# Patient Record
Sex: Male | Born: 1950
Health system: Southern US, Community
[De-identification: ages and names within clinical notes are randomized; demographics above are authoritative.]

## PROBLEM LIST (undated history)

## (undated) DIAGNOSIS — E785 Hyperlipidemia, unspecified: Secondary | ICD-10-CM

## (undated) DIAGNOSIS — I10 Essential (primary) hypertension: Secondary | ICD-10-CM

## (undated) HISTORY — DX: Hyperlipidemia, unspecified: E78.5

## (undated) HISTORY — DX: Essential (primary) hypertension: I10

---

## 1994-06-06 HISTORY — PX: NASAL SINUS SURGERY: SHX719

## 2001-06-06 HISTORY — PX: HERNIA REPAIR: SHX51

## 2002-07-12 ENCOUNTER — Encounter: Admission: RE | Admit: 2002-07-12 | Discharge: 2002-07-12 | Payer: Self-pay | Admitting: Family Medicine

## 2002-07-12 ENCOUNTER — Encounter: Payer: Self-pay | Admitting: Family Medicine

## 2013-06-13 ENCOUNTER — Ambulatory Visit: Payer: Self-pay | Admitting: Sports Medicine

## 2013-06-17 ENCOUNTER — Ambulatory Visit (INDEPENDENT_AMBULATORY_CARE_PROVIDER_SITE_OTHER): Payer: BC Managed Care – PPO | Admitting: Sports Medicine

## 2013-06-20 ENCOUNTER — Encounter: Payer: Self-pay | Admitting: Sports Medicine

## 2013-06-20 ENCOUNTER — Ambulatory Visit (INDEPENDENT_AMBULATORY_CARE_PROVIDER_SITE_OTHER): Payer: BC Managed Care – PPO | Admitting: Sports Medicine

## 2013-06-20 VITALS — BP 157/85 | HR 86 | Ht 67.0 in | Wt 158.0 lb

## 2013-06-20 DIAGNOSIS — I6381 Other cerebral infarction due to occlusion or stenosis of small artery: Secondary | ICD-10-CM | POA: Insufficient documentation

## 2013-06-20 DIAGNOSIS — Z299 Encounter for prophylactic measures, unspecified: Secondary | ICD-10-CM

## 2013-06-20 DIAGNOSIS — R200 Anesthesia of skin: Secondary | ICD-10-CM

## 2013-06-20 DIAGNOSIS — R030 Elevated blood-pressure reading, without diagnosis of hypertension: Secondary | ICD-10-CM

## 2013-06-20 DIAGNOSIS — M5137 Other intervertebral disc degeneration, lumbosacral region: Secondary | ICD-10-CM

## 2013-06-20 DIAGNOSIS — R209 Unspecified disturbances of skin sensation: Secondary | ICD-10-CM

## 2013-06-20 DIAGNOSIS — M5136 Other intervertebral disc degeneration, lumbar region: Secondary | ICD-10-CM

## 2013-06-20 DIAGNOSIS — M51379 Other intervertebral disc degeneration, lumbosacral region without mention of lumbar back pain or lower extremity pain: Secondary | ICD-10-CM

## 2013-06-20 DIAGNOSIS — M51369 Other intervertebral disc degeneration, lumbar region without mention of lumbar back pain or lower extremity pain: Secondary | ICD-10-CM | POA: Insufficient documentation

## 2013-06-20 DIAGNOSIS — I10 Essential (primary) hypertension: Secondary | ICD-10-CM | POA: Insufficient documentation

## 2013-06-20 DIAGNOSIS — K044 Acute apical periodontitis of pulpal origin: Secondary | ICD-10-CM

## 2013-06-20 DIAGNOSIS — Z0000 Encounter for general adult medical examination without abnormal findings: Secondary | ICD-10-CM | POA: Insufficient documentation

## 2013-06-20 DIAGNOSIS — K047 Periapical abscess without sinus: Secondary | ICD-10-CM | POA: Insufficient documentation

## 2013-06-20 MED ORDER — PREDNISONE 50 MG PO TABS: ORAL_TABLET | ORAL | Status: DC

## 2013-06-20 MED ORDER — MELOXICAM 15 MG PO TABS: ORAL_TABLET | ORAL | Status: DC

## 2013-06-20 MED ORDER — AMOXICILLIN-POT CLAVULANATE 875-125 MG PO TABS: 1.0000 | ORAL_TABLET | Freq: Two times a day (BID) | ORAL | Status: DC

## 2013-06-20 NOTE — Assessment & Plan Note (Signed)
He does have some pain near the site of a prior extracted tooth. I do hope the numbness in his face is secondary to swelling from this. I am going to treat him with 10 days of Augmentin.

## 2013-06-20 NOTE — Assessment & Plan Note (Signed)
This numbness does involve second branch of cranial nerve 5 and the third branch of cranial nerve 5. Unfortunately this raises the suspicion of a historical lacunar stroke. He does have an appointment with a neurologist. I do suspect he will need a brain MRI with concentration on the pons and fifth cranial nerve.

## 2013-06-20 NOTE — Progress Notes (Signed)
  Subjective:    CC: Establish care.   HPI:  Low back pain: Present for years, MRI in 2004 showed L4-5 and L5-S1 degenerative changes. Pain is axial, worse with coughing and sneezing, okay with severe car and there are no radicular component. Moderate, persistent. No bowel or bladder dysfunction, no constitutional symptoms.  Facial numbness: Left-sided, present for 2 months, no associated headache however he does have some toothache. No visual changes, no numbness or tingling on either side of his body, no changes in his speech or vision. He does have an appointment with neurologist coming up in a week or 2.  Elevated blood pressure: Tells me he usually has normal blood pressure readings.  Infected tooth: Pulled recently had a tooth pulled, now having pain at the site of the extraction. He wonders if this may be the cause of his left-sided facial numbness.  Past medical history, Surgical history, Family history not pertinant except as noted below, Social history, Allergies, and medications have been entered into the medical record, reviewed, and no changes needed.   Review of Systems: No headache, visual changes, nausea, vomiting, diarrhea, constipation, dizziness, abdominal pain, skin rash, fevers, chills, night sweats, swollen lymph nodes, weight loss, chest pain, body aches, joint swelling, muscle aches, shortness of breath, mood changes, visual or auditory hallucinations.  Objective:    General: Well Developed, well nourished, and in no acute distress.  Neuro: Alert and oriented x3, extra-ocular muscles intact, sensation grossly intact. Cranial nerves II through XII are intact with the exception of cranial nerves V2 and V3 on the left side. Motor, sensory, coordination are intact. HEENT: Normocephalic, atraumatic, pupils equal round reactive to light, neck supple, no masses, no lymphadenopathy, thyroid nonpalpable. I do not see any signs of gingivitis or acute infection, he does have some  necrotic teeth on the left side. Skin: Warm and dry, no rashes noted.  Cardiac: Regular rate and rhythm, no murmurs rubs or gallops.  Respiratory: Clear to auscultation bilaterally. Not using accessory muscles, speaking in full sentences.  Abdominal: Soft, nontender, nondistended, positive bowel sounds, no masses, no organomegaly.  Back Exam:  Inspection: Unremarkable  Motion: Flexion 45 deg, Extension 45 deg, Side Bending to 45 deg bilaterally,  Rotation to 45 deg bilaterally  SLR laying: Negative  XSLR laying: Negative  Palpable tenderness: None. FABER: negative. Sensory change: Gross sensation intact to all lumbar and sacral dermatomes.  Reflexes: 2+ at both patellar tendons, 2+ at achilles tendons, Babinski's downgoing.  Strength at foot  Plantar-flexion: 5/5 Dorsi-flexion: 5/5 Eversion: 5/5 Inversion: 5/5  Leg strength  Quad: 5/5 Hamstring: 5/5 Hip flexor: 5/5 Hip abductors: 5/5  Gait unremarkable.  Impression and Recommendations:    The patient was counselled, risk factors were discussed, anticipatory guidance given.

## 2013-06-20 NOTE — Assessment & Plan Note (Signed)
We will start conservatively with prednisone, Mobic, formal physical therapy. He does have an MRI that is 61 years old but shows L4-5 and L5-S1 degenerative disc disease.

## 2013-06-20 NOTE — Assessment & Plan Note (Signed)
We are going to keep an eye on this.

## 2013-06-20 NOTE — Assessment & Plan Note (Signed)
Checking routine bloodwork. 

## 2013-06-27 LAB — CBC
HCT: 42.5 % (ref 39.0–52.0)
Hemoglobin: 14.7 g/dL (ref 13.0–17.0)
MCH: 32.2 pg (ref 26.0–34.0)
MCHC: 34.6 g/dL (ref 30.0–36.0)
MCV: 93 fL (ref 78.0–100.0)
Platelets: 341 10*3/uL (ref 150–400)
RBC: 4.57 MIL/uL (ref 4.22–5.81)
RDW: 15.2 % (ref 11.5–15.5)
WBC: 8.8 10*3/uL (ref 4.0–10.5)

## 2013-06-27 LAB — HEMOGLOBIN A1C
Hgb A1c MFr Bld: 6 % — ABNORMAL HIGH (ref <5.7)
Mean Plasma Glucose: 126 mg/dL — ABNORMAL HIGH (ref <117)

## 2013-06-28 ENCOUNTER — Encounter: Payer: Self-pay | Admitting: Sports Medicine

## 2013-06-28 DIAGNOSIS — E785 Hyperlipidemia, unspecified: Secondary | ICD-10-CM | POA: Insufficient documentation

## 2013-06-28 DIAGNOSIS — R7303 Prediabetes: Secondary | ICD-10-CM | POA: Insufficient documentation

## 2013-06-28 LAB — COMPREHENSIVE METABOLIC PANEL
ALT: 35 U/L (ref 0–53)
AST: 20 U/L (ref 0–37)
Albumin: 4.3 g/dL (ref 3.5–5.2)
Alkaline Phosphatase: 52 U/L (ref 39–117)
BUN: 21 mg/dL (ref 6–23)
CO2: 32 mEq/L (ref 19–32)
Calcium: 9 mg/dL (ref 8.4–10.5)
Chloride: 100 mEq/L (ref 96–112)
Creat: 0.86 mg/dL (ref 0.50–1.35)
Glucose, Bld: 117 mg/dL — ABNORMAL HIGH (ref 70–99)
Potassium: 4 mEq/L (ref 3.5–5.3)
Sodium: 138 mEq/L (ref 135–145)
Total Bilirubin: 0.4 mg/dL (ref 0.3–1.2)
Total Protein: 6.8 g/dL (ref 6.0–8.3)

## 2013-06-28 LAB — LIPID PANEL
Cholesterol: 259 mg/dL — ABNORMAL HIGH (ref 0–200)
HDL: 58 mg/dL (ref >39)
LDL Cholesterol: 179 mg/dL — ABNORMAL HIGH (ref 0–99)
Total CHOL/HDL Ratio: 4.5 Ratio
Triglycerides: 110 mg/dL (ref <150)
VLDL: 22 mg/dL (ref 0–40)

## 2013-06-28 LAB — TSH: TSH: 2.072 u[IU]/mL (ref 0.350–4.500)

## 2013-07-18 ENCOUNTER — Encounter: Payer: Self-pay | Admitting: Sports Medicine

## 2013-07-18 ENCOUNTER — Ambulatory Visit (INDEPENDENT_AMBULATORY_CARE_PROVIDER_SITE_OTHER): Payer: BC Managed Care – PPO | Admitting: Sports Medicine

## 2013-07-18 VITALS — BP 161/92 | HR 80 | Ht 67.0 in | Wt 161.0 lb

## 2013-07-18 DIAGNOSIS — I10 Essential (primary) hypertension: Secondary | ICD-10-CM

## 2013-07-18 DIAGNOSIS — R200 Anesthesia of skin: Secondary | ICD-10-CM

## 2013-07-18 DIAGNOSIS — R209 Unspecified disturbances of skin sensation: Secondary | ICD-10-CM

## 2013-07-18 DIAGNOSIS — E785 Hyperlipidemia, unspecified: Secondary | ICD-10-CM

## 2013-07-18 MED ORDER — ATORVASTATIN CALCIUM 40 MG PO TABS: 40.0000 mg | ORAL_TABLET | Freq: Every day | ORAL | Status: DC

## 2013-07-18 MED ORDER — LISINOPRIL-HYDROCHLOROTHIAZIDE 20-25 MG PO TABS: 1.0000 | ORAL_TABLET | Freq: Every day | ORAL | Status: DC

## 2013-07-18 NOTE — Progress Notes (Signed)
  Subjective:    CC: Followup  HPI: Left facial numbness: This pleasant 63 year old male came in with highly elevated blood pressure and numbness on left side of the face that have been present outside the window for prophylaxis. My initial suspicion was a lacunar stroke in the brain stem. I sent him to neurology, subsequent MRI showed what appeared to be a small cyst versus small lacunar infarct, in a position that would explain his focal neurologic symptoms. Unfortunately the numbness on the left side of the face in the second and third branches of cranial nerve 5 distribution has persisted.  Hypertension: Continue to be elevated.  Hyperlipidemia: Not yet on a statin.  Lumbar degenerative disc disease: pain was predominantly axial, I placed him to conservative measures including prednisone and physical therapy, his back pain has now resolved.  Past medical history, Surgical history, Family history not pertinant except as noted below, Social history, Allergies, and medications have been entered into the medical record, reviewed, and no changes needed.   Review of Systems: No fevers, chills, night sweats, weight loss, chest pain, or shortness of breath.   Objective:    General: Well Developed, well nourished, and in no acute distress.  Neuro: Alert and oriented x3, extra-ocular muscles intact, sensation grossly intact.  HEENT: Normocephalic, atraumatic, pupils equal round reactive to light, neck supple, no masses, no lymphadenopathy, thyroid nonpalpable.  Skin: Warm and dry, no rashes. Cardiac: Regular rate and rhythm, no murmurs rubs or gallops, no lower extremity edema.  Respiratory: Clear to auscultation bilaterally. Not using accessory muscles, speaking in full sentences. Back Exam:  Inspection: Unremarkable  Motion: Flexion 45 deg, Extension 45 deg, Side Bending to 45 deg bilaterally,  Rotation to 45 deg bilaterally  SLR laying: Negative  XSLR laying: Negative  Palpable  tenderness: None. FABER: negative. Sensory change: Gross sensation intact to all lumbar and sacral dermatomes.  Reflexes: 2+ at both patellar tendons, 2+ at achilles tendons, Babinski's downgoing.  Strength at foot  Plantar-flexion: 5/5 Dorsi-flexion: 5/5 Eversion: 5/5 Inversion: 5/5  Leg strength  Quad: 5/5 Hamstring: 5/5 Hip flexor: 5/5 Hip abductors: 5/5  Gait unremarkable.  Impression and Recommendations:

## 2013-07-18 NOTE — Assessment & Plan Note (Addendum)
As suspected this likely represents a brainstem lacunar stroke. He is seeing a neurologist. The goal at this point his risk factor modification. Blood pressure control, high dose statin, continue aspirin.

## 2013-07-18 NOTE — Assessment & Plan Note (Signed)
Continues to be elevated. Adding lisinopril/hydrochlorothiazide.

## 2013-07-18 NOTE — Assessment & Plan Note (Signed)
Adding high-dose Lipitor. Aggressive risk factor modification due to recent lacunar stroke.

## 2013-07-24 DIAGNOSIS — Z0289 Encounter for other administrative examinations: Secondary | ICD-10-CM

## 2013-08-01 ENCOUNTER — Ambulatory Visit: Payer: BC Managed Care – PPO | Admitting: Sports Medicine

## 2013-08-19 ENCOUNTER — Ambulatory Visit (INDEPENDENT_AMBULATORY_CARE_PROVIDER_SITE_OTHER): Payer: BC Managed Care – PPO | Admitting: Sports Medicine

## 2013-08-19 ENCOUNTER — Encounter: Payer: Self-pay | Admitting: Sports Medicine

## 2013-08-19 VITALS — BP 178/82 | HR 70 | Ht 67.0 in | Wt 156.0 lb

## 2013-08-19 DIAGNOSIS — J387 Other diseases of larynx: Secondary | ICD-10-CM

## 2013-08-19 DIAGNOSIS — I10 Essential (primary) hypertension: Secondary | ICD-10-CM

## 2013-08-19 DIAGNOSIS — K219 Gastro-esophageal reflux disease without esophagitis: Secondary | ICD-10-CM | POA: Insufficient documentation

## 2013-08-19 MED ORDER — AMLODIPINE BESYLATE 10 MG PO TABS: 10.0000 mg | ORAL_TABLET | Freq: Every day | ORAL | Status: DC

## 2013-08-19 MED ORDER — DEXLANSOPRAZOLE 60 MG PO CPDR: 60.0000 mg | DELAYED_RELEASE_CAPSULE | Freq: Every day | ORAL | Status: DC

## 2013-08-19 NOTE — Assessment & Plan Note (Signed)
Adding some samples of Dexilant. If no better in one month I would recommend ENT referral for direct laryngoscopy, he is a smoker.

## 2013-08-19 NOTE — Assessment & Plan Note (Signed)
Improved, but not perfect. Adding amlodipine. Return in 2 weeks.

## 2013-08-19 NOTE — Progress Notes (Signed)
  Subjective:    CC: Followup  HPI: Hypertension: Improved significantly since starting lisinopril/hydrochlorothiazide, unfortunately still outside the range. No headaches, no further strokelike symptoms.  Sore throat : present for months, is worse after smoking, and when drinking coffee. Moderate, persistent, no cough, no blood.  Hyperlipidemia: Stable on statin.  Past medical history, Surgical history, Family history not pertinant except as noted below, Social history, Allergies, and medications have been entered into the medical record, reviewed, and no changes needed.   Review of Systems: No fevers, chills, night sweats, weight loss, chest pain, or shortness of breath.   Objective:    General: Well Developed, well nourished, and in no acute distress.  Neuro: Alert and oriented x3, extra-ocular muscles intact, sensation grossly intact.  HEENT: Normocephalic, atraumatic, pupils equal round reactive to light, neck supple, no masses, no lymphadenopathy, thyroid nonpalpable.  Skin: Warm and dry, no rashes. Cardiac: Regular rate and rhythm, no murmurs rubs or gallops, no lower extremity edema.  Respiratory: Clear to auscultation bilaterally. Not using accessory muscles, speaking in full sentences.  Impression and Recommendations:

## 2013-09-02 ENCOUNTER — Encounter: Payer: Self-pay | Admitting: Sports Medicine

## 2013-09-02 ENCOUNTER — Ambulatory Visit (INDEPENDENT_AMBULATORY_CARE_PROVIDER_SITE_OTHER): Payer: BC Managed Care – PPO | Admitting: Sports Medicine

## 2013-09-02 VITALS — BP 181/95 | HR 71 | Ht 67.0 in | Wt 158.0 lb

## 2013-09-02 DIAGNOSIS — K219 Gastro-esophageal reflux disease without esophagitis: Secondary | ICD-10-CM

## 2013-09-02 DIAGNOSIS — J387 Other diseases of larynx: Secondary | ICD-10-CM

## 2013-09-02 DIAGNOSIS — I10 Essential (primary) hypertension: Secondary | ICD-10-CM

## 2013-09-02 NOTE — Assessment & Plan Note (Signed)
Symptoms are persistent despite treatment with Dexilant. Considering his smoking history I do think he needs to see an ENT doctor for direct visualization.

## 2013-09-02 NOTE — Assessment & Plan Note (Signed)
Persistently elevated but he has not yet started amlodipine. He started amlodipine today, and return to see me in one week.

## 2013-09-02 NOTE — Progress Notes (Signed)
  Subjective:    CC: Follow up  HPI: Hypertension: Continue to be elevated, he does get occasional episodes of dizziness and does have a history of a lacunar stroke confirmed on MRI that has resulted in some perioral numbness. Unfortunately he has not yet started the amlodipine that was prescribed 2 weeks ago.  Throat soreness: Has now been treated for several weeks with Dexilant, unfortunately continues to have persistent symptoms, throat soreness, there has been no improvement, not even slightly with the PPI.  Past medical history, Surgical history, Family history not pertinant except as noted below, Social history, Allergies, and medications have been entered into the medical record, reviewed, and no changes needed.   Review of Systems: No fevers, chills, night sweats, weight loss, chest pain, or shortness of breath.   Objective:    General: Well Developed, well nourished, and in no acute distress.  Neuro: Alert and oriented x3, extra-ocular muscles intact, sensation grossly intact.  HEENT: Normocephalic, atraumatic, pupils equal round reactive to light, neck supple, no masses, no lymphadenopathy, thyroid nonpalpable.  Skin: Warm and dry, no rashes. Cardiac: Regular rate and rhythm, no murmurs rubs or gallops, no lower extremity edema.  Respiratory: Clear to auscultation bilaterally. Not using accessory muscles, speaking in full sentences.  Impression and Recommendations:

## 2013-09-09 ENCOUNTER — Ambulatory Visit (INDEPENDENT_AMBULATORY_CARE_PROVIDER_SITE_OTHER): Payer: BC Managed Care – PPO | Admitting: Sports Medicine

## 2013-09-09 ENCOUNTER — Encounter: Payer: Self-pay | Admitting: Sports Medicine

## 2013-09-09 VITALS — BP 123/76 | HR 78 | Ht 67.0 in | Wt 153.0 lb

## 2013-09-09 DIAGNOSIS — I6350 Cerebral infarction due to unspecified occlusion or stenosis of unspecified cerebral artery: Secondary | ICD-10-CM

## 2013-09-09 DIAGNOSIS — I10 Essential (primary) hypertension: Secondary | ICD-10-CM

## 2013-09-09 DIAGNOSIS — I6381 Other cerebral infarction due to occlusion or stenosis of small artery: Secondary | ICD-10-CM

## 2013-09-09 DIAGNOSIS — K219 Gastro-esophageal reflux disease without esophagitis: Secondary | ICD-10-CM

## 2013-09-09 DIAGNOSIS — J387 Other diseases of larynx: Secondary | ICD-10-CM

## 2013-09-09 MED ORDER — PREGABALIN 75 MG PO CAPS: 75.0000 mg | ORAL_CAPSULE | Freq: Two times a day (BID) | ORAL | Status: DC

## 2013-09-09 NOTE — Assessment & Plan Note (Signed)
Finally controlled.

## 2013-09-09 NOTE — Progress Notes (Signed)
  Subjective:    CC: Followup  HPI: Hypertension: Finally well controlled, he did take all of his medications.  Lacunar stroke: Continues to have left-sided intraoral numbness and tingling, he was seen by a dentist, dentist did not see any intervention needed.  Throat soreness: I have given him a diagnosis of laryngeal pharyngeal reflux disease, he has not improved despite a week of proton pump inhibitors, considering his history of smoking, we did refer him to ENT, he has not yet seen otolaryngology yet.  Hyperlipidemia: Stable on current statin.  Past medical history, Surgical history, Family history not pertinant except as noted below, Social history, Allergies, and medications have been entered into the medical record, reviewed, and no changes needed.   Review of Systems: No fevers, chills, night sweats, weight loss, chest pain, or shortness of breath.   Objective:    General: Well Developed, well nourished, and in no acute distress.  Neuro: Alert and oriented x3, extra-ocular muscles intact, sensation grossly intact.  HEENT: Normocephalic, atraumatic, pupils equal round reactive to light, neck supple, no masses, no lymphadenopathy, thyroid nonpalpable. No discrete areas of tenderness to palpation in the mouth. Teeth are not in great shape but are nontender, and not abscessed. Skin: Warm and dry, no rashes. Cardiac: Regular rate and rhythm, no murmurs rubs or gallops, no lower extremity edema.  Respiratory: Clear to auscultation bilaterally. Not using accessory muscles, speaking in full sentences.  Impression and Recommendations:

## 2013-09-09 NOTE — Assessment & Plan Note (Signed)
Not much better, awaiting ENT appointment.

## 2013-09-09 NOTE — Assessment & Plan Note (Addendum)
Continues to have pain in the left perioral region described as numbness and tingling. This is likely secondary to his lacunar stroke and unlikely to resolve. I am adding low-dose Lyrica for him to try.  Short-term disability paperwork filled out.

## 2013-10-02 ENCOUNTER — Telehealth: Payer: Self-pay

## 2013-10-02 NOTE — Telephone Encounter (Signed)
Forms along with OV note has been faxed to [facility] [employer]. Garron Eline,CMA

## 2013-10-02 NOTE — Telephone Encounter (Signed)
Completed form is in my box.

## 2013-10-02 NOTE — Telephone Encounter (Signed)
patient and his spouse has called multiple times requesting that a disability form be completed it was received on 10/01/2013. Per patient form needs to be faxed by 10/03/2013 in order for him to receive his short term disability. FORM IS IN YOU BASKET. Rhonda Cunningham,CMA

## 2013-10-21 ENCOUNTER — Encounter: Payer: Self-pay | Admitting: Sports Medicine

## 2013-10-21 ENCOUNTER — Ambulatory Visit (INDEPENDENT_AMBULATORY_CARE_PROVIDER_SITE_OTHER): Payer: BC Managed Care – PPO | Admitting: Sports Medicine

## 2013-10-21 VITALS — BP 145/85 | HR 91 | Ht 67.0 in | Wt 157.0 lb

## 2013-10-21 DIAGNOSIS — I6381 Other cerebral infarction due to occlusion or stenosis of small artery: Secondary | ICD-10-CM

## 2013-10-21 DIAGNOSIS — J387 Other diseases of larynx: Secondary | ICD-10-CM

## 2013-10-21 DIAGNOSIS — I6350 Cerebral infarction due to unspecified occlusion or stenosis of unspecified cerebral artery: Secondary | ICD-10-CM

## 2013-10-21 DIAGNOSIS — E785 Hyperlipidemia, unspecified: Secondary | ICD-10-CM

## 2013-10-21 DIAGNOSIS — K219 Gastro-esophageal reflux disease without esophagitis: Secondary | ICD-10-CM

## 2013-10-21 DIAGNOSIS — I10 Essential (primary) hypertension: Secondary | ICD-10-CM

## 2013-10-21 MED ORDER — AMITRIPTYLINE HCL 50 MG PO TABS: ORAL_TABLET | ORAL | Status: DC

## 2013-10-21 MED ORDER — HYDROCODONE-ACETAMINOPHEN 7.5-325 MG PO TABS: 1.0000 | ORAL_TABLET | Freq: Three times a day (TID) | ORAL | Status: DC | PRN

## 2013-10-21 NOTE — Assessment & Plan Note (Signed)
Continue proton pump inhibitor

## 2013-10-21 NOTE — Assessment & Plan Note (Signed)
He did see the ENT doctor. Flexible laryngoscopy was negative. Throat and mouth symptoms are thus likely related to his lacunar stroke. Lyrica was not effective, adding amitriptyline instead. I would also like him to see neurology.

## 2013-10-21 NOTE — Assessment & Plan Note (Signed)
We will keep an eye on this. If elevated next visit, we are going to start clonidine.

## 2013-10-21 NOTE — Progress Notes (Signed)
  Subjective:    CC:  Followup.  HPI: This pleasant 63 year old male smoker returns for followup of blood pressure, and oral symptoms.  He has had numbness around the mouth and soreness in his throat.  I sent him to ENT for direct visualization considering smoking history, this was negative.  Lyrica was also ineffective with helping his symptoms.  He did see a neurologist after his hospitalization, but desires to be referred to Dr. Loleta ChanceHill. For moderate, persistent.  Hypertension: Improved significantly.  Hyperlipidemia: Stable on Lipitor.  Past medical history, Surgical history, Family history not pertinant except as noted below, Social history, Allergies, and medications have been entered into the medical record, reviewed, and no changes needed.   Review of Systems: No fevers, chills, night sweats, weight loss, chest pain, or shortness of breath.   Objective:    General: Well Developed, well nourished, and in no acute distress.  Neuro: Alert and oriented x3, extra-ocular muscles intact, sensation grossly intact.  HEENT: Normocephalic, atraumatic, pupils equal round reactive to light, neck supple, no masses, no lymphadenopathy, thyroid nonpalpable.  Skin: Warm and dry, no rashes. Cardiac: Regular rate and rhythm, no murmurs rubs or gallops, no lower extremity edema.  Respiratory: Clear to auscultation bilaterally. Not using accessory muscles, speaking in full sentences.  Impression and Recommendations:

## 2013-10-21 NOTE — Assessment & Plan Note (Signed)
-  Continue Lipitor °

## 2013-11-18 ENCOUNTER — Encounter: Payer: Self-pay | Admitting: Sports Medicine

## 2013-11-18 ENCOUNTER — Ambulatory Visit (INDEPENDENT_AMBULATORY_CARE_PROVIDER_SITE_OTHER): Payer: BC Managed Care – PPO | Admitting: Sports Medicine

## 2013-11-18 VITALS — BP 114/67 | HR 90 | Ht 67.0 in | Wt 157.0 lb

## 2013-11-18 DIAGNOSIS — M51379 Other intervertebral disc degeneration, lumbosacral region without mention of lumbar back pain or lower extremity pain: Secondary | ICD-10-CM

## 2013-11-18 DIAGNOSIS — I6350 Cerebral infarction due to unspecified occlusion or stenosis of unspecified cerebral artery: Secondary | ICD-10-CM

## 2013-11-18 DIAGNOSIS — E785 Hyperlipidemia, unspecified: Secondary | ICD-10-CM

## 2013-11-18 DIAGNOSIS — I6381 Other cerebral infarction due to occlusion or stenosis of small artery: Secondary | ICD-10-CM

## 2013-11-18 DIAGNOSIS — M51369 Other intervertebral disc degeneration, lumbar region without mention of lumbar back pain or lower extremity pain: Secondary | ICD-10-CM

## 2013-11-18 DIAGNOSIS — M5136 Other intervertebral disc degeneration, lumbar region: Secondary | ICD-10-CM

## 2013-11-18 DIAGNOSIS — I10 Essential (primary) hypertension: Secondary | ICD-10-CM

## 2013-11-18 DIAGNOSIS — M5137 Other intervertebral disc degeneration, lumbosacral region: Secondary | ICD-10-CM

## 2013-11-18 NOTE — Assessment & Plan Note (Signed)
Good control, in fact systolics were in the 90s today. Asymptomatic.

## 2013-11-18 NOTE — Assessment & Plan Note (Signed)
Unfortunately has persistent left-sided facial numbness, and now has developed a tremor in the right hand. He is seeing his neurologist in 3 days, I have asked him to discontinue his amitriptyline.

## 2013-11-18 NOTE — Progress Notes (Signed)
  Subjective:    CC: Followup  HPI: Hypertension: Well controlled  Hyperlipidemia: Stable on Lipitor  Lacunar stroke colon with persistent left-sided facial numbness: Not improved with Lyrica, gabapentin, or amitriptyline now.  Tremor: Right-sided, this is new for the past couple of weeks, he has an appointment with his neurologist in 3 days.  Past medical history, Surgical history, Family history not pertinant except as noted below, Social history, Allergies, and medications have been entered into the medical record, reviewed, and no changes needed.   Review of Systems: No fevers, chills, night sweats, weight loss, chest pain, or shortness of breath.   Objective:    General: Well Developed, well nourished, and in no acute distress.  Neuro: Alert and oriented x3, extra-ocular muscles intact, sensation grossly intact. There is a right-sided tremor about 3 Hz. HEENT: Normocephalic, atraumatic, pupils equal round reactive to light, neck supple, no masses, no lymphadenopathy, thyroid nonpalpable.  Skin: Warm and dry, no rashes. Cardiac: Regular rate and rhythm, no murmurs rubs or gallops, no lower extremity edema.  Respiratory: Clear to auscultation bilaterally. Not using accessory muscles, speaking in full sentences.  Impression and Recommendations:

## 2013-11-18 NOTE — Assessment & Plan Note (Signed)
He did discuss some lower extremity weakness with ambulation, his last MRI of the lumbar spine was in 2004. I would like to see him back in a month, we can further evaluate the lumbar spine at that time.

## 2013-11-18 NOTE — Assessment & Plan Note (Signed)
-  Continue Lipitor °

## 2013-11-22 LAB — COMPREHENSIVE METABOLIC PANEL
ALT: 45 U/L (ref 0–53)
AST: 35 U/L (ref 0–37)
Albumin: 4.5 g/dL (ref 3.5–5.2)
BUN: 10 mg/dL (ref 6–23)
CO2: 26 mEq/L (ref 19–32)
Calcium: 9.9 mg/dL (ref 8.4–10.5)
Chloride: 96 mEq/L (ref 96–112)
Creat: 0.77 mg/dL (ref 0.50–1.35)
Sodium: 137 mEq/L (ref 135–145)

## 2013-11-22 LAB — COMPREHENSIVE METABOLIC PANEL WITH GFR
Alkaline Phosphatase: 73 U/L (ref 39–117)
Glucose, Bld: 157 mg/dL — ABNORMAL HIGH (ref 70–99)
Potassium: 3.8 meq/L (ref 3.5–5.3)
Total Bilirubin: 0.8 mg/dL (ref 0.2–1.2)
Total Protein: 7 g/dL (ref 6.0–8.3)

## 2013-11-22 LAB — CBC
HCT: 38.6 % — ABNORMAL LOW (ref 39.0–52.0)
Hemoglobin: 13.1 g/dL (ref 13.0–17.0)
MCH: 31.6 pg (ref 26.0–34.0)
MCHC: 33.9 g/dL (ref 30.0–36.0)
MCV: 93.2 fL (ref 78.0–100.0)
Platelets: 321 10*3/uL (ref 150–400)
RBC: 4.14 MIL/uL — ABNORMAL LOW (ref 4.22–5.81)
RDW: 15.1 % (ref 11.5–15.5)
WBC: 11.9 10*3/uL — ABNORMAL HIGH (ref 4.0–10.5)

## 2013-11-22 LAB — TSH: TSH: 2.256 u[IU]/mL (ref 0.350–4.500)

## 2013-11-22 LAB — HEMOGLOBIN A1C
Hgb A1c MFr Bld: 6.1 % — ABNORMAL HIGH (ref <5.7)
Mean Plasma Glucose: 128 mg/dL — ABNORMAL HIGH (ref <117)

## 2013-11-27 ENCOUNTER — Telehealth: Payer: Self-pay | Admitting: *Deleted

## 2013-11-27 ENCOUNTER — Encounter: Payer: Self-pay | Admitting: Sports Medicine

## 2013-11-27 NOTE — Telephone Encounter (Signed)
PT wife just wants it to be longer than June 30 she does not have a specific date in mind.

## 2013-11-27 NOTE — Telephone Encounter (Signed)
Pt wife called and would like pt written out of work longer than June 30.  SHe would like the work note faxed.  Please advise.  KG CMA

## 2013-11-27 NOTE — Telephone Encounter (Signed)
How long, needs specifics for the work note.

## 2013-11-27 NOTE — Telephone Encounter (Signed)
Letter in box. 

## 2013-12-09 ENCOUNTER — Ambulatory Visit: Payer: BC Managed Care – PPO | Admitting: Sports Medicine

## 2013-12-16 ENCOUNTER — Ambulatory Visit: Payer: BC Managed Care – PPO | Admitting: Sports Medicine

## 2013-12-19 ENCOUNTER — Ambulatory Visit (INDEPENDENT_AMBULATORY_CARE_PROVIDER_SITE_OTHER): Payer: BC Managed Care – PPO | Admitting: Sports Medicine

## 2013-12-19 ENCOUNTER — Encounter: Payer: Self-pay | Admitting: Sports Medicine

## 2013-12-19 VITALS — BP 122/72 | HR 93 | Ht 67.0 in | Wt 158.0 lb

## 2013-12-19 DIAGNOSIS — I6381 Other cerebral infarction due to occlusion or stenosis of small artery: Secondary | ICD-10-CM

## 2013-12-19 DIAGNOSIS — I10 Essential (primary) hypertension: Secondary | ICD-10-CM

## 2013-12-19 DIAGNOSIS — R7309 Other abnormal glucose: Secondary | ICD-10-CM

## 2013-12-19 DIAGNOSIS — I6350 Cerebral infarction due to unspecified occlusion or stenosis of unspecified cerebral artery: Secondary | ICD-10-CM

## 2013-12-19 DIAGNOSIS — R7303 Prediabetes: Secondary | ICD-10-CM

## 2013-12-19 DIAGNOSIS — E785 Hyperlipidemia, unspecified: Secondary | ICD-10-CM

## 2013-12-19 DIAGNOSIS — G4700 Insomnia, unspecified: Secondary | ICD-10-CM

## 2013-12-19 MED ORDER — ZOLPIDEM TARTRATE 10 MG PO TABS: 10.0000 mg | ORAL_TABLET | Freq: Every evening | ORAL | Status: DC | PRN

## 2013-12-19 NOTE — Progress Notes (Signed)
  Subjective:    CC: Followup  HPI: Hypertension: Well controlled.  Hyperlipidemia: Stable on Lipitor.  Lacunar stroke: Continued numbness in the mouth, amitriptyline, Lyrica, gabapentin have not been effective.  Tremor: Has seen a neurologist but has not discussed this in depth with him. He does have a visit coming up during which he will discuss it further.   Insomnia: Has been taking 3-4 shots of whiskey to help him sleep.  Past medical history, Surgical history, Family history not pertinant except as noted below, Social history, Allergies, and medications have been entered into the medical record, reviewed, and no changes needed.   Review of Systems: No fevers, chills, night sweats, weight loss, chest pain, or shortness of breath.   Objective:    General: Well Developed, well nourished, and in no acute distress.  Neuro: Alert and oriented x3, extra-ocular muscles intact, sensation grossly intact.  HEENT: Normocephalic, atraumatic, pupils equal round reactive to light, neck supple, no masses, no lymphadenopathy, thyroid nonpalpable.  Skin: Warm and dry, no rashes. Cardiac: Regular rate and rhythm, no murmurs rubs or gallops, no lower extremity edema.  Respiratory: Clear to auscultation bilaterally. Not using accessory muscles, speaking in full sentences.  Impression and Recommendations:

## 2013-12-19 NOTE — Assessment & Plan Note (Signed)
Starting Ambien. Advised against regular alcohol intake for sleeping.

## 2013-12-19 NOTE — Assessment & Plan Note (Signed)
Well controlled, no changes 

## 2013-12-19 NOTE — Assessment & Plan Note (Signed)
Overall well controlled.

## 2013-12-19 NOTE — Assessment & Plan Note (Signed)
Persistent oral numbness despite multiple medications. I think that this is going to be permanent.

## 2013-12-19 NOTE — Assessment & Plan Note (Signed)
Stable on Lipitor.

## 2014-01-04 ENCOUNTER — Other Ambulatory Visit: Payer: Self-pay | Admitting: Sports Medicine

## 2014-01-20 ENCOUNTER — Ambulatory Visit: Payer: BC Managed Care – PPO | Admitting: Sports Medicine

## 2014-01-21 ENCOUNTER — Encounter: Payer: Self-pay | Admitting: Sports Medicine

## 2014-01-21 ENCOUNTER — Ambulatory Visit (INDEPENDENT_AMBULATORY_CARE_PROVIDER_SITE_OTHER): Payer: BC Managed Care – PPO | Admitting: Sports Medicine

## 2014-01-21 VITALS — BP 109/67 | HR 78 | Ht 67.0 in | Wt 159.0 lb

## 2014-01-21 DIAGNOSIS — G4700 Insomnia, unspecified: Secondary | ICD-10-CM

## 2014-01-21 DIAGNOSIS — Q211 Atrial septal defect: Secondary | ICD-10-CM | POA: Insufficient documentation

## 2014-01-21 DIAGNOSIS — Q2111 Secundum atrial septal defect: Secondary | ICD-10-CM

## 2014-01-21 DIAGNOSIS — I6381 Other cerebral infarction due to occlusion or stenosis of small artery: Secondary | ICD-10-CM

## 2014-01-21 DIAGNOSIS — I6350 Cerebral infarction due to unspecified occlusion or stenosis of unspecified cerebral artery: Secondary | ICD-10-CM

## 2014-01-21 DIAGNOSIS — I10 Essential (primary) hypertension: Secondary | ICD-10-CM

## 2014-01-21 DIAGNOSIS — Q2112 Patent foramen ovale: Secondary | ICD-10-CM | POA: Insufficient documentation

## 2014-01-21 NOTE — Assessment & Plan Note (Signed)
Improved significantly with Ambien.

## 2014-01-21 NOTE — Assessment & Plan Note (Addendum)
Seen by neurology, he does have a tremor likely from the stroke. He has been placed on Mysoline and carbamazepine. Filled out more disability paperwork today.

## 2014-01-21 NOTE — Progress Notes (Signed)
  Subjective:    CC: Followup  HPI: Hypertension: Well controlled.  Lacunar stroke: Does have a tremor, neurology starting him on carbamazepine and Mysoline.  Patent foramen ovale: Diagnosed by cardiology, decision made not to treat for now.  Past medical history, Surgical history, Family history not pertinant except as noted below, Social history, Allergies, and medications have been entered into the medical record, reviewed, and no changes needed.   Review of Systems: No fevers, chills, night sweats, weight loss, chest pain, or shortness of breath.   Objective:    General: Well Developed, well nourished, and in no acute distress.  Neuro: Alert and oriented x3, extra-ocular muscles intact, sensation grossly intact.  HEENT: Normocephalic, atraumatic, pupils equal round reactive to light, neck supple, no masses, no lymphadenopathy, thyroid nonpalpable.  Skin: Warm and dry, no rashes. Cardiac: Regular rate and rhythm, no murmurs rubs or gallops, no lower extremity edema.  Respiratory: Clear to auscultation bilaterally. Not using accessory muscles, speaking in full sentences.  Impression and Recommendations:    I spent 25 minutes with this patient, greater than 50% face-to-face time counseling regarding the above diagnoses and filling out disability paperwork.

## 2014-01-21 NOTE — Assessment & Plan Note (Signed)
Well-controlled on amlodipine and lisinopril/hydrochlorothiazide

## 2014-02-07 ENCOUNTER — Other Ambulatory Visit: Payer: Self-pay | Admitting: *Deleted

## 2014-02-07 DIAGNOSIS — I10 Essential (primary) hypertension: Secondary | ICD-10-CM

## 2014-02-07 DIAGNOSIS — E785 Hyperlipidemia, unspecified: Secondary | ICD-10-CM

## 2014-02-07 MED ORDER — AMLODIPINE BESYLATE 10 MG PO TABS: 10.0000 mg | ORAL_TABLET | Freq: Every day | ORAL | Status: DC

## 2014-02-07 MED ORDER — LISINOPRIL-HYDROCHLOROTHIAZIDE 20-25 MG PO TABS: ORAL_TABLET | ORAL | Status: DC

## 2014-02-07 MED ORDER — ATORVASTATIN CALCIUM 40 MG PO TABS: 40.0000 mg | ORAL_TABLET | Freq: Every day | ORAL | Status: DC

## 2014-02-13 ENCOUNTER — Other Ambulatory Visit: Payer: Self-pay | Admitting: *Deleted

## 2014-02-13 ENCOUNTER — Other Ambulatory Visit: Payer: Self-pay | Admitting: Sports Medicine

## 2014-02-13 DIAGNOSIS — M5136 Other intervertebral disc degeneration, lumbar region: Secondary | ICD-10-CM

## 2014-02-13 DIAGNOSIS — G4700 Insomnia, unspecified: Secondary | ICD-10-CM

## 2014-02-13 MED ORDER — MELOXICAM 15 MG PO TABS: ORAL_TABLET | ORAL | Status: DC

## 2014-02-13 MED ORDER — ALBUTEROL SULFATE HFA 108 (90 BASE) MCG/ACT IN AERS: 2.0000 | INHALATION_SPRAY | Freq: Four times a day (QID) | RESPIRATORY_TRACT | Status: DC | PRN

## 2014-02-13 MED ORDER — ZOLPIDEM TARTRATE 10 MG PO TABS: 10.0000 mg | ORAL_TABLET | Freq: Every evening | ORAL | Status: DC | PRN

## 2014-02-13 MED ORDER — AMITRIPTYLINE HCL 50 MG PO TABS: ORAL_TABLET | ORAL | Status: DC

## 2014-04-23 ENCOUNTER — Ambulatory Visit: Payer: BC Managed Care – PPO | Admitting: Sports Medicine

## 2014-04-29 ENCOUNTER — Ambulatory Visit (INDEPENDENT_AMBULATORY_CARE_PROVIDER_SITE_OTHER): Payer: BC Managed Care – PPO | Admitting: Sports Medicine

## 2014-04-29 ENCOUNTER — Encounter: Payer: Self-pay | Admitting: Sports Medicine

## 2014-04-29 ENCOUNTER — Ambulatory Visit (INDEPENDENT_AMBULATORY_CARE_PROVIDER_SITE_OTHER): Payer: BC Managed Care – PPO

## 2014-04-29 VITALS — BP 114/72 | HR 94 | Wt 147.0 lb

## 2014-04-29 DIAGNOSIS — M1A0710 Idiopathic chronic gout, right ankle and foot, without tophus (tophi): Secondary | ICD-10-CM

## 2014-04-29 DIAGNOSIS — E785 Hyperlipidemia, unspecified: Secondary | ICD-10-CM

## 2014-04-29 DIAGNOSIS — I639 Cerebral infarction, unspecified: Secondary | ICD-10-CM

## 2014-04-29 DIAGNOSIS — M5136 Other intervertebral disc degeneration, lumbar region: Secondary | ICD-10-CM

## 2014-04-29 DIAGNOSIS — M47816 Spondylosis without myelopathy or radiculopathy, lumbar region: Secondary | ICD-10-CM

## 2014-04-29 DIAGNOSIS — I10 Essential (primary) hypertension: Secondary | ICD-10-CM | POA: Diagnosis not present

## 2014-04-29 DIAGNOSIS — M51369 Other intervertebral disc degeneration, lumbar region without mention of lumbar back pain or lower extremity pain: Secondary | ICD-10-CM

## 2014-04-29 DIAGNOSIS — M109 Gout, unspecified: Secondary | ICD-10-CM | POA: Insufficient documentation

## 2014-04-29 DIAGNOSIS — I6381 Other cerebral infarction due to occlusion or stenosis of small artery: Secondary | ICD-10-CM

## 2014-04-29 MED ORDER — ATORVASTATIN CALCIUM 40 MG PO TABS: 40.0000 mg | ORAL_TABLET | Freq: Every day | ORAL | Status: DC

## 2014-04-29 MED ORDER — LISINOPRIL-HYDROCHLOROTHIAZIDE 20-25 MG PO TABS: ORAL_TABLET | ORAL | Status: DC

## 2014-04-29 MED ORDER — AMLODIPINE BESYLATE 10 MG PO TABS: 10.0000 mg | ORAL_TABLET | Freq: Every day | ORAL | Status: DC

## 2014-04-29 MED ORDER — AMITRIPTYLINE HCL 50 MG PO TABS: ORAL_TABLET | ORAL | Status: DC

## 2014-04-29 MED ORDER — FEBUXOSTAT 40 MG PO TABS: 80.0000 mg | ORAL_TABLET | Freq: Every day | ORAL | Status: DC

## 2014-04-29 MED ORDER — ORPHENADRINE CITRATE ER 100 MG PO TB12: 100.0000 mg | ORAL_TABLET | Freq: Two times a day (BID) | ORAL | Status: DC

## 2014-04-29 MED ORDER — CLOPIDOGREL BISULFATE 75 MG PO TABS: 75.0000 mg | ORAL_TABLET | Freq: Every day | ORAL | Status: DC

## 2014-04-29 NOTE — Assessment & Plan Note (Signed)
With progressive right-sided weakness and S1 versus L5 radiculopathy. Lumbar spine MRI, x-rays. Return to go over MRI results, this will likely proceed to an epidural injection.

## 2014-04-29 NOTE — Assessment & Plan Note (Signed)
Stable on current medications, no changes. 

## 2014-04-29 NOTE — Assessment & Plan Note (Signed)
Stable on Lipitor, no changes.

## 2014-04-29 NOTE — Assessment & Plan Note (Signed)
Stable on Uloric. Most recent uric acid levels were approximately 3 years ago, and were 3.

## 2014-04-29 NOTE — Assessment & Plan Note (Signed)
Stable, tremor has improved with Mysoline and carbamazepine prescribed by neurology.

## 2014-04-29 NOTE — Progress Notes (Signed)
  Subjective:    CC: follow-up  HPI: Hypertension: Well controlled.  Lacunar stroke: Ended up with some tremor, improved significantly with Mysoline and carbamazepine prescribed by neurology.  Gout: Recently had a flare, podagra in the right foot, currently uses Uloric, on further review of his [facility] chart his most recent uric acid levels on Uloric was 3.  Low back pain:with right-sided S1 radiculopathy, as well as progressive weakness in the right leg. Moderate, persistent without constitutional symptoms or bowel or bladder dysfunction.  Hyperlipidemia: Stable on statin.  Past medical history, Surgical history, Family history not pertinant except as noted below, Social history, Allergies, and medications have been entered into the medical record, reviewed, and no changes needed.   Review of Systems: No fevers, chills, night sweats, weight loss, chest pain, or shortness of breath.   Objective:    General: Well Developed, well nourished, and in no acute distress.  Neuro: Alert and oriented x3, extra-ocular muscles intact, sensation grossly intact.  HEENT: Normocephalic, atraumatic, pupils equal round reactive to light, neck supple, no masses, no lymphadenopathy, thyroid nonpalpable.  Skin: Warm and dry, no rashes. Cardiac: Regular rate and rhythm, no murmurs rubs or gallops, no lower extremity edema.  Respiratory: Clear to auscultation bilaterally. Not using accessory muscles, speaking in full sentences. Back Exam:  Inspection: Unremarkable  Motion: Flexion 45 deg, Extension 45 deg, Side Bending to 45 deg bilaterally,  Rotation to 45 deg bilaterally  SLR laying: Negative  XSLR laying: Negative  Palpable tenderness: None. FABER: negative. Sensory change: Gross sensation intact to all lumbar and sacral dermatomes.  Reflexes: 2+ at both patellar tendons, 2+ at achilles tendons, Babinski's downgoing. There is clonus in the right ankle Strength at foot  Plantar-flexion: 5/5  Dorsi-flexion: 5/5 Eversion: 5/5 Inversion: 5/5  Leg strength  Quad: 5/5 Hamstring: 5/5 Hip flexor: 5/5 Hip abductors: 5/5  Gait unremarkable.  Impression and Recommendations:

## 2014-05-06 ENCOUNTER — Telehealth: Payer: Self-pay | Admitting: *Deleted

## 2014-05-06 NOTE — Telephone Encounter (Signed)
No prior auth required for MRI as per Aram CandelaJohn R @ [employer]. Corliss SkainsJamie Tamyah Cutbirth, CMA

## 2015-03-05 ENCOUNTER — Ambulatory Visit (INDEPENDENT_AMBULATORY_CARE_PROVIDER_SITE_OTHER): Payer: 59 | Admitting: Sports Medicine

## 2015-03-05 ENCOUNTER — Encounter: Payer: Self-pay | Admitting: Sports Medicine

## 2015-03-05 ENCOUNTER — Ambulatory Visit (INDEPENDENT_AMBULATORY_CARE_PROVIDER_SITE_OTHER): Payer: 59

## 2015-03-05 ENCOUNTER — Other Ambulatory Visit: Payer: Self-pay | Admitting: Sports Medicine

## 2015-03-05 VITALS — BP 127/63 | HR 93 | Ht 67.0 in | Wt 140.0 lb

## 2015-03-05 DIAGNOSIS — S92321A Displaced fracture of second metatarsal bone, right foot, initial encounter for closed fracture: Secondary | ICD-10-CM | POA: Diagnosis not present

## 2015-03-05 DIAGNOSIS — X58XXXA Exposure to other specified factors, initial encounter: Secondary | ICD-10-CM

## 2015-03-05 DIAGNOSIS — Q809 Congenital ichthyosis, unspecified: Secondary | ICD-10-CM | POA: Insufficient documentation

## 2015-03-05 DIAGNOSIS — S99921A Unspecified injury of right foot, initial encounter: Secondary | ICD-10-CM

## 2015-03-05 DIAGNOSIS — S92321G Displaced fracture of second metatarsal bone, right foot, subsequent encounter for fracture with delayed healing: Secondary | ICD-10-CM | POA: Diagnosis not present

## 2015-03-05 DIAGNOSIS — X58XXXD Exposure to other specified factors, subsequent encounter: Secondary | ICD-10-CM

## 2015-03-05 MED ORDER — CLOBETASOL PROPIONATE 0.05 % EX CREA: 1.0000 "application " | TOPICAL_CREAM | Freq: Two times a day (BID) | CUTANEOUS | Status: DC

## 2015-03-05 NOTE — Progress Notes (Signed)
  Subjective:    CC: Right foot injury  HPI: This is a pleasant 64 year old male, 4 weeks ago he dropped his cane on his foot, he had immediate pain, swelling, bruising, but never sought medical attention. He is here today with persistent pain, swelling, and deformity over his dorsal right midfoot. Pain is moderate, persistent. No radiation.  Past medical history, Surgical history, Family history not pertinant except as noted below, Social history, Allergies, and medications have been entered into the medical record, reviewed, and no changes needed.   Review of Systems: No fevers, chills, night sweats, weight loss, chest pain, or shortness of breath.   Objective:    General: Well Developed, well nourished, and in no acute distress.  Neuro: Alert and oriented x3, extra-ocular muscles intact, sensation grossly intact.  HEENT: Normocephalic, atraumatic, pupils equal round reactive to light, neck supple, no masses, no lymphadenopathy, thyroid nonpalpable.  Skin: Warm and dry, no rashes. Cardiac: Regular rate and rhythm, no murmurs rubs or gallops, no lower extremity edema.  Respiratory: Clear to auscultation bilaterally. Not using accessory muscles, speaking in full sentences. Right foot: Visibly swollen, palpable deformity over the dorsum of the midfoot. This correlates to the second metatarsal shaft. He is neurovascularly intact distally  X-rays personally reviewed and show a spiral, displaced fracture through the distal shaft of the second metatarsal.  Procedure:  Attempted Fracture Reduction   Risks, benefits, and alternatives explained and consent obtained. Time out conducted. Surface prepped with alcohol. 5 mL lidocaine, 5 mL Marcaine infiltrated in a hematoma block. Adequate anesthesia ensured. Fracture reduction: Attempt at closed reduction, since fracture is 4 weeks out there was no movement. Post reduction films obtained showed anatomic/near-anatomic alignment. Pt stable,  aftercare and follow-up advised. Patient was also advised that he would need open reduction and internal fixation  Impression and Recommendations:

## 2015-03-05 NOTE — Assessment & Plan Note (Signed)
I suspect that this is simple xeroderma with lichenification of the skin secondary to crossing his legs and topical abrasion. They will keep it moisturized and I am going to add topical clobetasol.

## 2015-03-05 NOTE — Assessment & Plan Note (Addendum)
Injury 4 weeks ago, cane fell on his foot. X-ray show a displaced fracture of the second metatarsal shaft. Failed attempt at closed reduction. Referral to orthopedic surgery for consideration of ORIF.

## 2015-04-06 ENCOUNTER — Ambulatory Visit (INDEPENDENT_AMBULATORY_CARE_PROVIDER_SITE_OTHER): Payer: 59 | Admitting: Sports Medicine

## 2015-04-06 ENCOUNTER — Encounter: Payer: Self-pay | Admitting: Sports Medicine

## 2015-04-06 VITALS — BP 140/75 | HR 80 | Wt 145.0 lb

## 2015-04-06 DIAGNOSIS — G4700 Insomnia, unspecified: Secondary | ICD-10-CM

## 2015-04-06 DIAGNOSIS — S92321D Displaced fracture of second metatarsal bone, right foot, subsequent encounter for fracture with routine healing: Secondary | ICD-10-CM

## 2015-04-06 DIAGNOSIS — M1A0710 Idiopathic chronic gout, right ankle and foot, without tophus (tophi): Secondary | ICD-10-CM

## 2015-04-06 MED ORDER — ZALEPLON 10 MG PO CAPS: 10.0000 mg | ORAL_CAPSULE | Freq: Every evening | ORAL | Status: DC | PRN

## 2015-04-06 NOTE — Progress Notes (Signed)
  Subjective:    CC: follow-up  HPI: Second metatarsal fracture: Did touch base with foot surgery, they are going to continue with conservative management.  Insomnia: Wants to try Sonata, insufficient response to Ambien, and was unable to obtain Belsomra.  Gout: Currently on Uloric, has never had a uric acid levels rechecked  Past medical history, Surgical history, Family history not pertinant except as noted below, Social history, Allergies, and medications have been entered into the medical record, reviewed, and no changes needed.   Review of Systems: No fevers, chills, night sweats, weight loss, chest pain, or shortness of breath.   Objective:    General: Well Developed, well nourished, and in no acute distress.  Neuro: Alert and oriented x3, extra-ocular muscles intact, sensation grossly intact.  HEENT: Normocephalic, atraumatic, pupils equal round reactive to light, neck supple, no masses, no lymphadenopathy, thyroid nonpalpable.  Skin: Warm and dry, no rashes. Cardiac: Regular rate and rhythm, no murmurs rubs or gallops, no lower extremity edema.  Respiratory: Clear to auscultation bilaterally. Not using accessory muscles, speaking in full sentences.  Impression and Recommendations:

## 2015-04-06 NOTE — Assessment & Plan Note (Signed)
Essentially pain-free in the boot now 6-7 weeks post fracture, he did touch base with foot surgery, they're simply going to treat this nonoperatively. Further management per foot and ankle surgery.

## 2015-04-06 NOTE — Assessment & Plan Note (Signed)
Unable to get coverage with Belsomra, switching to max dose Sonata per patient request.

## 2015-04-06 NOTE — Assessment & Plan Note (Signed)
Checking uric acid levels, we will augment the dose of Uloric accordingly.

## 2015-05-04 ENCOUNTER — Ambulatory Visit: Payer: 59 | Admitting: Sports Medicine

## 2015-05-11 ENCOUNTER — Other Ambulatory Visit: Payer: Self-pay | Admitting: Sports Medicine

## 2015-05-11 MED ORDER — LISINOPRIL-HYDROCHLOROTHIAZIDE 20-25 MG PO TABS: ORAL_TABLET | ORAL | Status: DC

## 2015-05-11 MED ORDER — CLOPIDOGREL BISULFATE 75 MG PO TABS: 75.0000 mg | ORAL_TABLET | Freq: Every day | ORAL | Status: DC

## 2015-06-03 ENCOUNTER — Other Ambulatory Visit: Payer: Self-pay

## 2015-06-03 DIAGNOSIS — E785 Hyperlipidemia, unspecified: Secondary | ICD-10-CM

## 2015-06-03 DIAGNOSIS — I10 Essential (primary) hypertension: Secondary | ICD-10-CM

## 2015-06-03 MED ORDER — ATORVASTATIN CALCIUM 40 MG PO TABS: 40.0000 mg | ORAL_TABLET | Freq: Every day | ORAL | Status: DC

## 2015-06-03 MED ORDER — AMLODIPINE BESYLATE 10 MG PO TABS: 10.0000 mg | ORAL_TABLET | Freq: Every day | ORAL | Status: DC

## 2015-08-13 ENCOUNTER — Ambulatory Visit (INDEPENDENT_AMBULATORY_CARE_PROVIDER_SITE_OTHER): Payer: BLUE CROSS/BLUE SHIELD | Admitting: Sports Medicine

## 2015-08-13 ENCOUNTER — Encounter: Payer: Self-pay | Admitting: Sports Medicine

## 2015-08-13 VITALS — BP 134/71 | HR 75 | Resp 18 | Ht 67.0 in | Wt 146.1 lb

## 2015-08-13 DIAGNOSIS — Z720 Tobacco use: Secondary | ICD-10-CM

## 2015-08-13 DIAGNOSIS — F17200 Nicotine dependence, unspecified, uncomplicated: Secondary | ICD-10-CM | POA: Insufficient documentation

## 2015-08-13 DIAGNOSIS — Z299 Encounter for prophylactic measures, unspecified: Secondary | ICD-10-CM | POA: Diagnosis not present

## 2015-08-13 DIAGNOSIS — E785 Hyperlipidemia, unspecified: Secondary | ICD-10-CM | POA: Diagnosis not present

## 2015-08-13 DIAGNOSIS — I10 Essential (primary) hypertension: Secondary | ICD-10-CM

## 2015-08-13 DIAGNOSIS — R0989 Other specified symptoms and signs involving the circulatory and respiratory systems: Secondary | ICD-10-CM | POA: Insufficient documentation

## 2015-08-13 DIAGNOSIS — M1A0710 Idiopathic chronic gout, right ankle and foot, without tophus (tophi): Secondary | ICD-10-CM

## 2015-08-13 DIAGNOSIS — J449 Chronic obstructive pulmonary disease, unspecified: Secondary | ICD-10-CM

## 2015-08-13 DIAGNOSIS — I6381 Other cerebral infarction due to occlusion or stenosis of small artery: Secondary | ICD-10-CM

## 2015-08-13 DIAGNOSIS — I639 Cerebral infarction, unspecified: Secondary | ICD-10-CM

## 2015-08-13 DIAGNOSIS — R221 Localized swelling, mass and lump, neck: Secondary | ICD-10-CM

## 2015-08-13 MED ORDER — CLOPIDOGREL BISULFATE 75 MG PO TABS: 75.0000 mg | ORAL_TABLET | Freq: Every day | ORAL | Status: DC

## 2015-08-13 NOTE — Assessment & Plan Note (Signed)
Checking uric acid levels.

## 2015-08-13 NOTE — Assessment & Plan Note (Signed)
Wheezing, return for lung function test pre-and postbronchodilator.  This is likely early emphysema. Also considering smoking history we need a screening CT of the chest.

## 2015-08-13 NOTE — Progress Notes (Signed)
  Subjective:    CC: Complete physical  HPI:  This is a pleasant 65 year old male smoker who is here for a physical.  Hypertension: Controlled  Lacunar stroke: Stable on Plavix, persistent and likely permanent left-sided paresthesias from the face to the toes.  Hyperlipidemia: Due to recheck lipids  Smoker: Smoked for decades, greater than 30-pack-year, he has developed cough, wheeze, as well as a mass in his neck. He has also had some weight loss.  Past medical history, Surgical history, Family history not pertinant except as noted below, Social history, Allergies, and medications have been entered into the medical record, reviewed, and no changes needed.   Review of Systems: No headache, visual changes, nausea, vomiting, diarrhea, constipation, dizziness, abdominal pain, skin rash, fevers, chills, night sweats, swollen lymph nodes, weight loss, chest pain, body aches, joint swelling, muscle aches, shortness of breath, mood changes, visual or auditory hallucinations.  Objective:    General: Well Developed, well nourished, and in no acute distress.  Neuro: Alert and oriented x3, extra-ocular muscles intact, sensation grossly intact.  HEENT: Normocephalic, atraumatic, pupils equal round reactive to light, neck supple, no masses, no lymphadenopathy, thyroid nonpalpable. There is nontender lymphadenopathy in the cervical chain, there is also a questionable mass over the larynx.  Skin: Warm and dry, no rashes noted.  Cardiac: Regular rate and rhythm, no murmurs rubs or gallops.  Respiratory: Expiratory wheezing throughout. Not using accessory muscles, speaking in full sentences.  Abdominal: Soft, nontender, nondistended, positive bowel sounds, no masses, no organomegaly.  Musculoskeletal: Shoulder, elbow, wrist, hip, knee, ankle stable, and with full range of motion.  Impression and Recommendations:    The patient was counselled, risk factors were discussed, anticipatory guidance  given.

## 2015-08-13 NOTE — Assessment & Plan Note (Signed)
Well controlled, no changes 

## 2015-08-13 NOTE — Assessment & Plan Note (Signed)
With bilateral cervical lymphadenopathy. History of smoking. CT of the neck and soft tissues with IV contrast.

## 2015-08-13 NOTE — Assessment & Plan Note (Signed)
Checking fasting lipids. 

## 2015-08-13 NOTE — Assessment & Plan Note (Signed)
Complete physical as above. 

## 2015-08-13 NOTE — Assessment & Plan Note (Signed)
Refilling Plavix, he does have some dizziness and tenderness, advised that he does need an appointment with ear nose and throat, but patient declines.

## 2015-08-25 ENCOUNTER — Other Ambulatory Visit: Payer: Self-pay | Admitting: Sports Medicine

## 2015-08-25 ENCOUNTER — Other Ambulatory Visit: Payer: Self-pay

## 2015-08-25 DIAGNOSIS — I6381 Other cerebral infarction due to occlusion or stenosis of small artery: Secondary | ICD-10-CM

## 2015-08-25 DIAGNOSIS — I10 Essential (primary) hypertension: Secondary | ICD-10-CM

## 2015-08-25 MED ORDER — AMLODIPINE BESYLATE 10 MG PO TABS: 10.0000 mg | ORAL_TABLET | Freq: Every day | ORAL | Status: DC

## 2015-08-25 MED ORDER — CARBAMAZEPINE 200 MG PO TABS: 400.0000 mg | ORAL_TABLET | Freq: Two times a day (BID) | ORAL | Status: DC

## 2015-09-01 ENCOUNTER — Telehealth: Payer: Self-pay | Admitting: *Deleted

## 2015-09-01 NOTE — Telephone Encounter (Signed)
PA submitted through covermymeds for Uloric  Possibly may be denied due to no documentation that patient has tried allopurinol 300 mg. [facility]

## 2015-09-03 NOTE — Telephone Encounter (Signed)
uloric was denied due to not having tried allopurinol 300 mg. Denial letter placed in Dr. Effie Berkshirehekkandam's box

## 2015-09-25 ENCOUNTER — Telehealth: Payer: Self-pay | Admitting: *Deleted

## 2015-09-25 NOTE — Telephone Encounter (Signed)
Per Dr. Benjamin Stainhekkekandam the patient has tried and failed allopurinol 300 mg in the past. Per Dr. Karie Schwalbe will [facility]

## 2015-10-09 ENCOUNTER — Telehealth: Payer: Self-pay | Admitting: *Deleted

## 2015-10-09 NOTE — Telephone Encounter (Signed)
closed

## 2015-10-09 NOTE — Telephone Encounter (Signed)
Checked on status of Uloric:  Approvedon April 21  Effective from 09/25/2015 through 06/05/2038.  Spouse notified

## 2015-10-13 ENCOUNTER — Telehealth: Payer: Self-pay

## 2015-10-13 DIAGNOSIS — M1A0710 Idiopathic chronic gout, right ankle and foot, without tophus (tophi): Secondary | ICD-10-CM

## 2015-10-13 MED ORDER — ALLOPURINOL 300 MG PO TABS: 300.0000 mg | ORAL_TABLET | Freq: Every day | ORAL | Status: DC

## 2015-10-13 NOTE — Telephone Encounter (Signed)
Pt was taking Uloric but medication is now $1,000 wife is wondering if there is a generic version or another medication the pt can take. Please advise.

## 2015-10-13 NOTE — Telephone Encounter (Signed)
Pt states he does not remember trying this medication

## 2015-10-13 NOTE — Telephone Encounter (Signed)
Medications sent in.

## 2015-10-13 NOTE — Telephone Encounter (Signed)
Has there been any intolerance in the past to allopurinol?

## 2016-07-18 ENCOUNTER — Other Ambulatory Visit: Payer: Self-pay

## 2016-07-18 DIAGNOSIS — I6381 Other cerebral infarction due to occlusion or stenosis of small artery: Secondary | ICD-10-CM

## 2016-07-18 DIAGNOSIS — M1A0710 Idiopathic chronic gout, right ankle and foot, without tophus (tophi): Secondary | ICD-10-CM

## 2016-07-18 DIAGNOSIS — I10 Essential (primary) hypertension: Secondary | ICD-10-CM

## 2016-07-18 DIAGNOSIS — E7849 Other hyperlipidemia: Secondary | ICD-10-CM

## 2016-07-18 MED ORDER — CLOPIDOGREL BISULFATE 75 MG PO TABS: 75.0000 mg | ORAL_TABLET | Freq: Every day | ORAL | 1 refills | Status: DC

## 2016-07-18 MED ORDER — ATORVASTATIN CALCIUM 40 MG PO TABS: 40.0000 mg | ORAL_TABLET | Freq: Every day | ORAL | 1 refills | Status: DC

## 2016-07-18 MED ORDER — LISINOPRIL-HYDROCHLOROTHIAZIDE 20-25 MG PO TABS: ORAL_TABLET | ORAL | 1 refills | Status: DC

## 2016-07-18 MED ORDER — AMLODIPINE BESYLATE 10 MG PO TABS
10.0000 mg | ORAL_TABLET | Freq: Every day | ORAL | 1 refills | Status: DC
Start: 2016-07-18 — End: 2017-03-14

## 2016-07-18 MED ORDER — CLOPIDOGREL BISULFATE 75 MG PO TABS
75.0000 mg | ORAL_TABLET | Freq: Every day | ORAL | 1 refills | Status: DC
Start: 2016-07-18 — End: 2017-05-09

## 2016-07-18 MED ORDER — ALLOPURINOL 300 MG PO TABS: 300.0000 mg | ORAL_TABLET | Freq: Every day | ORAL | 1 refills | Status: DC

## 2016-07-18 MED ORDER — AMLODIPINE BESYLATE 10 MG PO TABS: 10.0000 mg | ORAL_TABLET | Freq: Every day | ORAL | 1 refills | Status: DC

## 2016-10-19 ENCOUNTER — Other Ambulatory Visit: Payer: Self-pay | Admitting: Sports Medicine

## 2016-10-19 DIAGNOSIS — I6381 Other cerebral infarction due to occlusion or stenosis of small artery: Secondary | ICD-10-CM

## 2016-10-19 MED ORDER — CARBAMAZEPINE 200 MG PO TABS: 400.0000 mg | ORAL_TABLET | Freq: Two times a day (BID) | ORAL | 0 refills | Status: DC

## 2017-02-16 ENCOUNTER — Ambulatory Visit: Payer: BLUE CROSS/BLUE SHIELD | Admitting: Sports Medicine

## 2017-03-14 ENCOUNTER — Ambulatory Visit (INDEPENDENT_AMBULATORY_CARE_PROVIDER_SITE_OTHER): Payer: Medicare HMO | Admitting: Sports Medicine

## 2017-03-14 ENCOUNTER — Encounter: Payer: Self-pay | Admitting: Sports Medicine

## 2017-03-14 VITALS — BP 73/44 | HR 76 | Resp 20 | Ht 66.0 in | Wt 135.0 lb

## 2017-03-14 DIAGNOSIS — E039 Hypothyroidism, unspecified: Secondary | ICD-10-CM

## 2017-03-14 DIAGNOSIS — J449 Chronic obstructive pulmonary disease, unspecified: Secondary | ICD-10-CM | POA: Diagnosis not present

## 2017-03-14 DIAGNOSIS — R0989 Other specified symptoms and signs involving the circulatory and respiratory systems: Secondary | ICD-10-CM

## 2017-03-14 DIAGNOSIS — Z8639 Personal history of other endocrine, nutritional and metabolic disease: Secondary | ICD-10-CM | POA: Diagnosis not present

## 2017-03-14 DIAGNOSIS — J189 Pneumonia, unspecified organism: Secondary | ICD-10-CM

## 2017-03-14 DIAGNOSIS — M1A0710 Idiopathic chronic gout, right ankle and foot, without tophus (tophi): Secondary | ICD-10-CM | POA: Diagnosis not present

## 2017-03-14 DIAGNOSIS — R740 Nonspecific elevation of levels of transaminase and lactic acid dehydrogenase [LDH]: Secondary | ICD-10-CM | POA: Diagnosis not present

## 2017-03-14 DIAGNOSIS — Z0000 Encounter for general adult medical examination without abnormal findings: Secondary | ICD-10-CM | POA: Diagnosis not present

## 2017-03-14 DIAGNOSIS — R221 Localized swelling, mass and lump, neck: Secondary | ICD-10-CM | POA: Diagnosis not present

## 2017-03-14 DIAGNOSIS — R627 Adult failure to thrive: Secondary | ICD-10-CM

## 2017-03-14 DIAGNOSIS — R7401 Elevation of levels of liver transaminase levels: Secondary | ICD-10-CM

## 2017-03-14 DIAGNOSIS — E785 Hyperlipidemia, unspecified: Secondary | ICD-10-CM

## 2017-03-14 DIAGNOSIS — F17200 Nicotine dependence, unspecified, uncomplicated: Secondary | ICD-10-CM

## 2017-03-14 DIAGNOSIS — E7800 Pure hypercholesterolemia, unspecified: Secondary | ICD-10-CM | POA: Diagnosis not present

## 2017-03-14 DIAGNOSIS — I10 Essential (primary) hypertension: Secondary | ICD-10-CM

## 2017-03-14 DIAGNOSIS — I6381 Other cerebral infarction due to occlusion or stenosis of small artery: Secondary | ICD-10-CM

## 2017-03-14 MED ORDER — MIRTAZAPINE 15 MG PO TABS: 15.0000 mg | ORAL_TABLET | Freq: Every day | ORAL | 3 refills | Status: DC

## 2017-03-14 NOTE — Assessment & Plan Note (Signed)
Continue atorvastatin, checking ESR, CK.

## 2017-03-14 NOTE — Progress Notes (Addendum)
Subjective:   Alejandro Ramirez is a 66 y.o. male who presents for Medicare Annual/Subsequent preventive examination.  He has multiple issues.   Failure to thrive: Persistent weight loss over the past several years, I tried multiple times to get routine blood work, imaging studies and age-appropriate cancer screening, he continues to skip the blood work and the imaging studies. At this point he complains of dysphagia, he does have a history of a lacunar stroke resulting in oral paresthesias. He has lost 30-40 pounds over the past couple of years, he's become weak, off balance. He complains of difficulty swallowing, food getting stuck but no overt aspiration.  Smoker: Has still not proceeded with CT lung cancer screening.  Hypertension: Blood pressure is in the 70s systolic, he feels weak, still taking blood pressure medication.  Hyperlipidemia: Lipitor 40, complains of some widespread muscle aches and pains, again has skipped out on all of the blood work that we try to get in the past several years   Review of Systems:  Review of systems is negative except as noted above in the history of present illness       Objective:    Vitals: BP (!) 73/44   Pulse 76   Resp 20   Ht  (1.676 m)   Wt 135 lb (61.2 kg)   SpO2 98%   BMI 21.79 kg/m   Body mass index is 21.79 kg/m.  Tobacco History  Smoking Status  . Current Every Day Smoker  Smokeless Tobacco  . Never Used     Ready to quit: Not Answered Counseling given: Not Answered   Past Medical History:  Diagnosis Date  . Hyperlipidemia   . Hypertension    Past Surgical History:  Procedure Laterality Date  . HERNIA REPAIR  2003  . NASAL SINUS SURGERY  1996   No family history on file. History  Sexual Activity  . Sexual activity: Not on file    Outpatient Encounter Prescriptions as of 03/14/2017  Medication Sig  . albuterol (PROVENTIL HFA;VENTOLIN HFA) 108 (90 BASE) MCG/ACT inhaler Inhale 2 puffs into the lungs  every 6 (six) hours as needed for wheezing.  Marland Kitchen allopurinol (ZYLOPRIM) 300 MG tablet Take 1 tablet (300 mg total) by mouth daily.  Marland Kitchen atorvastatin (LIPITOR) 40 MG tablet Take 1 tablet (40 mg total) by mouth daily.  Marland Kitchen azithromycin (ZITHROMAX Z-PAK) 250 MG tablet Take 2 tablets (500 mg) on  Day 1,  followed by 1 tablet (250 mg) once daily on Days 2 through 5.  . carbamazepine (TEGRETOL) 200 MG tablet Take 2 tablets (400 mg total) by mouth 2 (two) times daily.  . clopidogrel (PLAVIX) 75 MG tablet Take 1 tablet (75 mg total) by mouth daily.  Marland Kitchen levothyroxine (SYNTHROID, LEVOTHROID) 50 MCG tablet Take 1 tablet (50 mcg total) by mouth daily.  . mirtazapine (REMERON) 15 MG tablet Take 1 tablet (15 mg total) by mouth at bedtime.  . primidone (MYSOLINE) 50 MG tablet Take 2 tablets (100 mg total) by mouth at bedtime.  . Vitamin D, Ergocalciferol, (DRISDOL) 50000 units CAPS capsule Take 1 capsule (50,000 Units total) by mouth every 7 (seven) days. Take for 8 total doses(weeks)  . [DISCONTINUED] amitriptyline (ELAVIL) 50 MG tablet One half tab PO qHS for a week, then one tab PO qHS.  . [DISCONTINUED] amLODipine (NORVASC) 10 MG tablet Take 1 tablet (10 mg total) by mouth daily.  . [DISCONTINUED] clobetasol cream (TEMOVATE) 0.05 % Apply 1 application topically 2 (  two) times daily. To right lower leg  . [DISCONTINUED] lisinopril-hydrochlorothiazide (PRINZIDE,ZESTORETIC) 20-25 MG tablet TAKE ONE TABLET BY MOUTH ONE TIME DAILY  . [DISCONTINUED] orphenadrine (NORFLEX) 100 MG tablet Take 1 tablet (100 mg total) by mouth 2 (two) times daily.  . [DISCONTINUED] zaleplon (SONATA) 10 MG capsule Take 1 capsule (10 mg total) by mouth at bedtime as needed for sleep.   No facility-administered encounter medications on file as of 03/14/2017.     Activities of Daily Living In your present state of health, do you have any difficulty performing the following activities: 03/14/2017  Hearing? N  Vision? N  Difficulty concentrating  or making decisions? N  Walking or climbing stairs? Y  Dressing or bathing? Y  Doing errands, shopping? Y  Some recent data might be hidden   General: Well-developed, frail appearing, and in no acute distress.  Neuro: Alert and oriented x3, extra-ocular muscles intact, sensation grossly intact. Cranial nerves II through XII are intact, motor, sensory, and coordinative functions are all intact. HEENT: Normocephalic, atraumatic, pupils equal round reactive to light, neck supple, no masses, mild bilateral cervical lymphadenopathy, thyroid nonpalpable. Oropharynx, nasopharynx, external ear canals are unremarkable. Skin: Warm and dry, no rashes noted.  Cardiac: Regular rate and rhythm, no murmurs rubs or gallops.  Respiratory: Clear to auscultation bilaterally. Not using accessory muscles, speaking in full sentences.  Abdominal: Soft, nontender, nondistended, positive bowel sounds, no masses, no organomegaly.  Musculoskeletal: Shoulder, elbow, wrist, hip, knee, ankle stable, and with full range of motion.  Patient Care Team: Monica Becton, MD as PCP - General (Sports Medicine) Lenord Carbo., MD as Consulting Physician (Neurology)   Assessment:    66 year old male with failure to thrive Exercise Activities and Dietary recommendations    Goals    None     Fall Risk No flowsheet data found. Depression Screen PHQ 2/9 Scores 03/14/2017 03/14/2017  PHQ - 2 Score 4 2  PHQ- 9 Score 15 -    Cognitive Function     6CIT Screen 03/14/2017  What Year? 0 points  What month? 0 points  What time? 0 points  Count back from 20 0 points  Months in reverse 0 points  Repeat phrase 6 points  Total Score 6     There is no immunization history on file for this patient. Screening Tests Health Maintenance  Topic Date Due  . COLONOSCOPY  06/06/2020  . TETANUS/TDAP  06/06/2020  . Hepatitis C Screening  Completed  . HIV Screening  Completed      Plan:  See below in assessment  and plan for further details.   I have personally reviewed and noted the following in the patient's chart:   . Medical and social history . Use of alcohol, tobacco or illicit drugs  . Current medications and supplements . Functional ability and status . Nutritional status . Physical activity . Advanced directives . List of other physicians . Hospitalizations, surgeries, and ER visits in previous 12 months . Vitals . Screenings to include cognitive, depression, and falls . Referrals and appointments  In addition, I have reviewed and discussed with patient certain preventive protocols, quality metrics, and best practice recommendations. A written personalized care plan for preventive services as well as general preventive health recommendations were provided to patient.   I spent 40 minutes with this patient, greater than 50% was face-to-face time counseling regarding the above diagnoses, this was in addition to the time spent performing the Medicare physical.   Rodney Langton,  MD  03/20/2017

## 2017-03-14 NOTE — Assessment & Plan Note (Signed)
Greater than 50-pack-years. Screening chest CT, I try to get this done over the past several years but the patient is noncompliant and does not proceed with imaging studies as recommended.  He was informed multiple times of the risks.

## 2017-03-14 NOTE — Assessment & Plan Note (Signed)
Hypotensive without tachycardia, this is chronic failure to thrive. Persistent weight loss. Discontinuing his blood pressure medications. Return in a month.

## 2017-03-14 NOTE — Assessment & Plan Note (Signed)
Catching him up on screening measures, Medicare physical today.

## 2017-03-14 NOTE — Assessment & Plan Note (Addendum)
This has seemingly resulted in dysphagia.  He has had steady weight loss over the past several years. I would like him to touch base with ENT and gastroenterology.

## 2017-03-14 NOTE — Assessment & Plan Note (Signed)
We have been trying for years to determine the cause of this neck mass, I have recommended CT of the neck with contrast, he has never proceeded with the imaging studies we have recommended, he has a long history of smoking, bilateral cervical adenopathy, he understood the risks in the past with noncompliance with recommended imaging, we are going to try again.

## 2017-03-14 NOTE — Assessment & Plan Note (Addendum)
With depression, chronic debilitation. Checking blood work, adding mirtazapine. Discontinuing medications that have resulted in falls. Adding home health physical therapy.  1. TSH is elevated, adding T3 and T4 levels, please call the lab and add these on, also adding levothyroxine 50 g, recheck in 6 weeks. 2. Vitamin D is low, calling in supplementation. 3. Elevated transaminases, adding hepatic ultrasound. 4. Normocytic anemia, we will keep an eye on this for now.

## 2017-03-14 NOTE — Assessment & Plan Note (Signed)
Checking uric acid levels.

## 2017-03-15 DIAGNOSIS — E039 Hypothyroidism, unspecified: Secondary | ICD-10-CM | POA: Insufficient documentation

## 2017-03-15 DIAGNOSIS — R7401 Elevation of levels of liver transaminase levels: Secondary | ICD-10-CM | POA: Insufficient documentation

## 2017-03-15 DIAGNOSIS — R740 Nonspecific elevation of levels of transaminase and lactic acid dehydrogenase [LDH]: Secondary | ICD-10-CM

## 2017-03-15 LAB — CBC
HCT: 35 % — ABNORMAL LOW (ref 38.5–50.0)
Hemoglobin: 12.2 g/dL — ABNORMAL LOW (ref 13.2–17.1)
MCH: 31.9 pg (ref 27.0–33.0)
MCHC: 34.9 g/dL (ref 32.0–36.0)
MCV: 91.4 fL (ref 80.0–100.0)
MPV: 9.1 fL (ref 7.5–12.5)
Platelets: 271 10*3/uL (ref 140–400)
RBC: 3.83 Million/uL — ABNORMAL LOW (ref 4.20–5.80)
RDW: 13 % (ref 11.0–15.0)
WBC: 5.3 10*3/uL (ref 3.8–10.8)

## 2017-03-15 LAB — T4, FREE: Free T4: 1.2 ng/dL (ref 0.8–1.8)

## 2017-03-15 LAB — COMPREHENSIVE METABOLIC PANEL WITH GFR
AG Ratio: 1.9 (calc) (ref 1.0–2.5)
AST: 83 U/L — ABNORMAL HIGH (ref 10–35)
Alkaline phosphatase (APISO): 66 U/L (ref 40–115)
CO2: 27 mmol/L (ref 20–32)
Chloride: 95 mmol/L — ABNORMAL LOW (ref 98–110)
Globulin: 2.3 g/dL (ref 1.9–3.7)
Glucose, Bld: 101 mg/dL — ABNORMAL HIGH (ref 65–99)

## 2017-03-15 LAB — COMPREHENSIVE METABOLIC PANEL
ALT: 95 U/L — ABNORMAL HIGH (ref 9–46)
Albumin: 4.4 g/dL (ref 3.6–5.1)
BUN: 13 mg/dL (ref 7–25)
Calcium: 10 mg/dL (ref 8.6–10.3)
Creat: 0.89 mg/dL (ref 0.70–1.25)
Potassium: 4.4 mmol/L (ref 3.5–5.3)
Sodium: 135 mmol/L (ref 135–146)
Total Bilirubin: 1.2 mg/dL (ref 0.2–1.2)
Total Protein: 6.7 g/dL (ref 6.1–8.1)

## 2017-03-15 LAB — TSH: TSH: 4.82 m[IU]/L — ABNORMAL HIGH (ref 0.40–4.50)

## 2017-03-15 LAB — TEST AUTHORIZATION

## 2017-03-15 LAB — LIPID PANEL W/REFLEX DIRECT LDL
Cholesterol: 168 mg/dL (ref <200)
HDL: 96 mg/dL (ref >40)
LDL Cholesterol (Calc): 57 mg/dL (calc)
Non-HDL Cholesterol (Calc): 72 mg/dL (ref <130)
Total CHOL/HDL Ratio: 1.8 (calc) (ref <5.0)
Triglycerides: 72 mg/dL (ref <150)

## 2017-03-15 LAB — HEMOGLOBIN A1C
Hgb A1c MFr Bld: 5.5 % of total Hgb (ref <5.7)
Mean Plasma Glucose: 111 (calc)
eAG (mmol/L): 6.2 (calc)

## 2017-03-15 LAB — CK: Total CK: 54 U/L (ref 44–196)

## 2017-03-15 LAB — SEDIMENTATION RATE: Sed Rate: 22 mm/h — ABNORMAL HIGH (ref 0–20)

## 2017-03-15 LAB — T3, FREE: T3, Free: 3.7 pg/mL (ref 2.3–4.2)

## 2017-03-15 LAB — VITAMIN B12: Vitamin B-12: 450 pg/mL (ref 200–1100)

## 2017-03-15 LAB — VITAMIN D 25 HYDROXY (VIT D DEFICIENCY, FRACTURES): Vit D, 25-Hydroxy: 13 ng/mL — ABNORMAL LOW (ref 30–100)

## 2017-03-15 LAB — HEPATITIS C ANTIBODY
Hepatitis C Ab: NONREACTIVE
SIGNAL TO CUT-OFF: 0.01 (ref <1.00)

## 2017-03-15 LAB — HIV ANTIBODY (ROUTINE TESTING W REFLEX): HIV 1&2 Ab, 4th Generation: NONREACTIVE

## 2017-03-15 LAB — URIC ACID: Uric Acid, Serum: 6.9 mg/dL (ref 4.0–8.0)

## 2017-03-15 MED ORDER — LEVOTHYROXINE SODIUM 50 MCG PO TABS: 50.0000 ug | ORAL_TABLET | Freq: Every day | ORAL | 3 refills | Status: DC

## 2017-03-15 MED ORDER — VITAMIN D (ERGOCALCIFEROL) 1.25 MG (50000 UNIT) PO CAPS: 50000.0000 [IU] | ORAL_CAPSULE | ORAL | 0 refills | Status: DC

## 2017-03-15 NOTE — Assessment & Plan Note (Signed)
Unclear etiology, adding liver ultrasound. Because he's having unexpected weight loss we are going to be somewhat more aggressive regarding imaging and chasing abnormal lab results.

## 2017-03-15 NOTE — Addendum Note (Signed)
Addended by: Monica Becton on: 03/15/2017 08:45 AM   Modules accepted: Orders

## 2017-03-15 NOTE — Assessment & Plan Note (Signed)
TSH is elevated, adding T3 and T4 levels, please call the lab and add these on, also adding levothyroxine 50 g, recheck in 6 weeks.

## 2017-03-17 ENCOUNTER — Other Ambulatory Visit: Payer: Self-pay | Admitting: Sports Medicine

## 2017-03-17 DIAGNOSIS — F17200 Nicotine dependence, unspecified, uncomplicated: Secondary | ICD-10-CM

## 2017-03-17 DIAGNOSIS — R740 Nonspecific elevation of levels of transaminase and lactic acid dehydrogenase [LDH]: Principal | ICD-10-CM

## 2017-03-17 DIAGNOSIS — R7401 Elevation of levels of liver transaminase levels: Secondary | ICD-10-CM

## 2017-03-20 ENCOUNTER — Ambulatory Visit (HOSPITAL_BASED_OUTPATIENT_CLINIC_OR_DEPARTMENT_OTHER): Payer: Medicare HMO

## 2017-03-20 ENCOUNTER — Ambulatory Visit (INDEPENDENT_AMBULATORY_CARE_PROVIDER_SITE_OTHER): Payer: Medicare HMO

## 2017-03-20 ENCOUNTER — Ambulatory Visit: Payer: Medicare HMO

## 2017-03-20 DIAGNOSIS — I77811 Abdominal aortic ectasia: Secondary | ICD-10-CM

## 2017-03-20 DIAGNOSIS — Z87891 Personal history of nicotine dependence: Secondary | ICD-10-CM | POA: Diagnosis not present

## 2017-03-20 DIAGNOSIS — J189 Pneumonia, unspecified organism: Secondary | ICD-10-CM | POA: Insufficient documentation

## 2017-03-20 DIAGNOSIS — R7989 Other specified abnormal findings of blood chemistry: Secondary | ICD-10-CM | POA: Diagnosis not present

## 2017-03-20 DIAGNOSIS — F17200 Nicotine dependence, unspecified, uncomplicated: Secondary | ICD-10-CM

## 2017-03-20 DIAGNOSIS — K760 Fatty (change of) liver, not elsewhere classified: Secondary | ICD-10-CM

## 2017-03-20 DIAGNOSIS — R740 Nonspecific elevation of levels of transaminase and lactic acid dehydrogenase [LDH]: Principal | ICD-10-CM

## 2017-03-20 DIAGNOSIS — R7401 Elevation of levels of liver transaminase levels: Secondary | ICD-10-CM

## 2017-03-20 MED ORDER — AZITHROMYCIN 250 MG PO TABS: ORAL_TABLET | ORAL | 0 refills | Status: DC

## 2017-03-20 NOTE — Addendum Note (Signed)
Addended by: Monica Becton on: 03/20/2017 05:32 PM   Modules accepted: Orders

## 2017-03-20 NOTE — Assessment & Plan Note (Signed)
Unclear etiology but multiple groundglass nodules on CT. Adding azithromycin, referral to pulmonology for assistance, with his chronic debilitation weight loss I think there may be more going on here than a simple atypical pneumonia.

## 2017-03-21 ENCOUNTER — Other Ambulatory Visit: Payer: Self-pay

## 2017-03-30 DIAGNOSIS — I6529 Occlusion and stenosis of unspecified carotid artery: Secondary | ICD-10-CM | POA: Diagnosis not present

## 2017-03-30 DIAGNOSIS — R221 Localized swelling, mass and lump, neck: Secondary | ICD-10-CM | POA: Diagnosis not present

## 2017-03-30 DIAGNOSIS — J320 Chronic maxillary sinusitis: Secondary | ICD-10-CM | POA: Diagnosis not present

## 2017-03-30 DIAGNOSIS — I672 Cerebral atherosclerosis: Secondary | ICD-10-CM | POA: Diagnosis not present

## 2017-04-03 ENCOUNTER — Telehealth: Payer: Self-pay

## 2017-04-03 NOTE — Telephone Encounter (Signed)
Notified patient.

## 2017-04-03 NOTE — Telephone Encounter (Signed)
CT shows no malignancies in the neck, he does have bilateral chronic type thickening of the wall of the sinuses, the more pressing issue is the groundglass appearance of both lungs, does he have an appointment yet with pulmonology?  Has he done his azithromycin?

## 2017-04-03 NOTE — Telephone Encounter (Signed)
Notified patient's of results.  Has not had appointment with Pulmonologist yet.  Also, has not gotten the script yet.

## 2017-04-03 NOTE — Telephone Encounter (Signed)
Pt's wife called regarding the results from CT and [facility].  Please advise.

## 2017-04-03 NOTE — Telephone Encounter (Signed)
Remind him that it was sent electronically almost 2 weeks ago.  He needs to get it ASAP.

## 2017-04-10 ENCOUNTER — Telehealth: Payer: Self-pay | Admitting: Sports Medicine

## 2017-04-10 NOTE — Telephone Encounter (Signed)
Dr. Karie Schwalbe    Home Health called today and they have still not been able to get patient scheduled they have talked with them but the family just hasn't scheduled yet. Is this a referral you are still wanting him  to have and if so can we get a new order so they can get them scheduled.   Arline Aspindy

## 2017-04-10 NOTE — Telephone Encounter (Signed)
I just put that in a month ago, why do we need to new referral order?  Give them my verbal order to push harder to get him scheduled.

## 2017-04-11 ENCOUNTER — Ambulatory Visit (INDEPENDENT_AMBULATORY_CARE_PROVIDER_SITE_OTHER): Payer: Medicare HMO | Admitting: Sports Medicine

## 2017-04-11 ENCOUNTER — Encounter: Payer: Self-pay | Admitting: Sports Medicine

## 2017-04-11 DIAGNOSIS — R627 Adult failure to thrive: Secondary | ICD-10-CM | POA: Diagnosis not present

## 2017-04-11 DIAGNOSIS — J189 Pneumonia, unspecified organism: Secondary | ICD-10-CM

## 2017-04-11 MED ORDER — IBUPROFEN 800 MG PO TABS: 800.0000 mg | ORAL_TABLET | Freq: Three times a day (TID) | ORAL | 2 refills | Status: AC | PRN

## 2017-04-11 MED ORDER — MIRTAZAPINE 30 MG PO TABS: 30.0000 mg | ORAL_TABLET | Freq: Every day | ORAL | 3 refills | Status: DC

## 2017-04-11 NOTE — Assessment & Plan Note (Signed)
Depression, chronic debilitation. Good 5 pound increase in weight with low-dose mirtazapine, increasing to 30 mg. He will have home health physical therapy coming in, we have also done an age-appropriate cancer screening which was likely negative including CT of the neck.

## 2017-04-11 NOTE — Progress Notes (Signed)
  Subjective:    CC: Follow-up  HPI: Depression/failure to thrive: Improved on Remeron, 5 pound weight gain, has still not started home health therapy.  Still smoking.  Past medical history:  Negative.  See flowsheet/record as well for more information.  Surgical history: Negative.  See flowsheet/record as well for more information.  Family history: Negative.  See flowsheet/record as well for more information.  Social history: Negative.  See flowsheet/record as well for more information.  Allergies, and medications have been entered into the medical record, reviewed, and no changes needed.   Review of Systems: No fevers, chills, night sweats, weight loss, chest pain, or shortness of breath.   Objective:    General: Well Developed, well nourished, and in no acute distress.  Neuro: Alert and oriented x3, extra-ocular muscles intact, sensation grossly intact.  HEENT: Normocephalic, atraumatic, pupils equal round reactive to light, neck supple, no masses, no lymphadenopathy, thyroid nonpalpable.  Skin: Warm and dry, no rashes. Cardiac: Regular rate and rhythm, no murmurs rubs or gallops, no lower extremity edema.  Respiratory: Clear to auscultation bilaterally. Not using accessory muscles, speaking in full sentences.  Impression and Recommendations:    Atypical pneumonia Clear etiology but still with multiple groundglass nodules on CT. May be a little bit better after using the azithromycin but he continues to smoke like a chimney. Needs to keep follow-up with pulmonology.  Failure to thrive in adult Depression, chronic debilitation. Good 5 pound increase in weight with low-dose mirtazapine, increasing to 30 mg. He will have home health physical therapy coming in, we have also done an age-appropriate cancer screening which was likely negative including CT of the neck.  I spent 25 minutes with this patient, greater than 50% was face-to-face time counseling regarding the above  diagnoses ___________________________________________ Ihor Austinhomas J. Benjamin Stainhekkekandam, M.D., ABFM., CAQSM. Primary Care and Sports Medicine [facility]  Adjunct Instructor of Family Medicine  [employer]

## 2017-04-11 NOTE — Assessment & Plan Note (Signed)
Clear etiology but still with multiple groundglass nodules on CT. May be a little bit better after using the azithromycin but he continues to smoke like a chimney. Needs to keep follow-up with pulmonology.

## 2017-05-01 ENCOUNTER — Other Ambulatory Visit: Payer: Self-pay | Admitting: *Deleted

## 2017-05-01 NOTE — Patient Outreach (Signed)
[employer] HRA screening attempte. Member did not answer the phone and I was unable to leave a message. I will call again within the next 10 days.  Zara Councilarroll C. Burgess EstelleSpinks, MSN, [facility]NP-BC Gerontological Nurse Practitioner [facility] Care Management [phone]

## 2017-05-09 ENCOUNTER — Ambulatory Visit (INDEPENDENT_AMBULATORY_CARE_PROVIDER_SITE_OTHER): Payer: Medicare HMO | Admitting: Sports Medicine

## 2017-05-09 ENCOUNTER — Encounter: Payer: Self-pay | Admitting: Sports Medicine

## 2017-05-09 ENCOUNTER — Other Ambulatory Visit: Payer: Self-pay | Admitting: *Deleted

## 2017-05-09 DIAGNOSIS — I6381 Other cerebral infarction due to occlusion or stenosis of small artery: Secondary | ICD-10-CM | POA: Diagnosis not present

## 2017-05-09 DIAGNOSIS — I10 Essential (primary) hypertension: Secondary | ICD-10-CM

## 2017-05-09 DIAGNOSIS — E039 Hypothyroidism, unspecified: Secondary | ICD-10-CM

## 2017-05-09 DIAGNOSIS — M1A0710 Idiopathic chronic gout, right ankle and foot, without tophus (tophi): Secondary | ICD-10-CM | POA: Diagnosis not present

## 2017-05-09 DIAGNOSIS — R627 Adult failure to thrive: Secondary | ICD-10-CM | POA: Diagnosis not present

## 2017-05-09 DIAGNOSIS — E7849 Other hyperlipidemia: Secondary | ICD-10-CM

## 2017-05-09 MED ORDER — CLOPIDOGREL BISULFATE 75 MG PO TABS: 75.0000 mg | ORAL_TABLET | Freq: Every day | ORAL | 3 refills | Status: AC

## 2017-05-09 MED ORDER — ALBUTEROL SULFATE HFA 108 (90 BASE) MCG/ACT IN AERS: 2.0000 | INHALATION_SPRAY | Freq: Four times a day (QID) | RESPIRATORY_TRACT | 11 refills | Status: AC | PRN

## 2017-05-09 MED ORDER — MIRTAZAPINE 45 MG PO TABS: 45.0000 mg | ORAL_TABLET | Freq: Every day | ORAL | 3 refills | Status: AC

## 2017-05-09 MED ORDER — LEVOTHYROXINE SODIUM 50 MCG PO TABS: 50.0000 ug | ORAL_TABLET | Freq: Every day | ORAL | 3 refills | Status: AC

## 2017-05-09 MED ORDER — ALLOPURINOL 300 MG PO TABS: 300.0000 mg | ORAL_TABLET | Freq: Every day | ORAL | 3 refills | Status: DC

## 2017-05-09 MED ORDER — ATORVASTATIN CALCIUM 40 MG PO TABS: 40.0000 mg | ORAL_TABLET | Freq: Every day | ORAL | 3 refills | Status: AC

## 2017-05-09 NOTE — Progress Notes (Signed)
  Subjective:    CC: Weight check  HPI: This is a pleasant 66 year old male, he became somewhat depressed, lost a lot of weight, met the criteria for failure to thrive.  We started him on mirtazapine 15 mg, he gained a bit of weight, at the last visit we increased to 30 mg and he gained an additional 7 pounds.  He feels stronger, his pain is less, his mood is better, and his wife is happier as he is eating much more, he is becoming more functional, eager to go up on the dose.  Hypothyroidism: Doing well on 50 mcg of levothyroxine.  Hypertension: After his weight loss he no longer needed the medication, we will continue off of all medications.  Past medical history:  Negative.  See flowsheet/record as well for more information.  Surgical history: Negative.  See flowsheet/record as well for more information.  Family history: Negative.  See flowsheet/record as well for more information.  Social history: Negative.  See flowsheet/record as well for more information.  Allergies, and medications have been entered into the medical record, reviewed, and no changes needed.   Review of Systems: No fevers, chills, night sweats, weight loss, chest pain, or shortness of breath.   Objective:    General: Well Developed, well nourished, and in no acute distress.  Neuro: Alert and oriented x3, extra-ocular muscles intact, sensation grossly intact.  HEENT: Normocephalic, atraumatic, pupils equal round reactive to light, neck supple, no masses, no lymphadenopathy, thyroid nonpalpable.  Skin: Warm and dry, no rashes. Cardiac: Regular rate and rhythm, no murmurs rubs or gallops, no lower extremity edema.  Respiratory: Clear to auscultation bilaterally. Not using accessory muscles, speaking in full sentences.  Impression and Recommendations:    Failure to thrive in adult 7 pound weight gain with the increase to 30, increasing Remeron to 45 mg. Return in 1 month.  Hypertension, essential, benign Doing  well off of blood pressure medications, no restarting it.  Hypothyroidism Continue levothyroxine 50 mcg. At his return visit he should come fasting and we can do all of the routine blood work including uric acid levels.  I spent 25 minutes with this patient, greater than 50% was face-to-face time counseling regarding the above diagnoses ___________________________________________ Gwen Her. Dianah Field, M.D., ABFM., CAQSM. Primary Care and [place] Instructor of [employer] [facility] [employer]

## 2017-05-09 NOTE — Assessment & Plan Note (Addendum)
Continue levothyroxine 50 mcg. At his return visit he should come fasting and we can do all of the routine blood work including uric acid levels.

## 2017-05-09 NOTE — Assessment & Plan Note (Signed)
Doing well off of blood pressure medications, no restarting it.

## 2017-05-09 NOTE — Patient Outreach (Signed)
[employer] HRA attempted. I talked with Mr. Alejandro Ramirez briefly and he wants me to call back tomorrow to talk with his wife Alejandro Ramirez. I do note that pt [facility]. I will glady call tomorrow to let the couple know about [facility].  Zara Councilarroll C. Burgess EstelleSpinks, MSN, [facility]NP-BC Gerontological Nurse Practitioner [facility] Care Management [phone]

## 2017-05-09 NOTE — Assessment & Plan Note (Signed)
7 pound weight gain with the increase to 30, increasing Remeron to 45 mg. Return in 1 month.

## 2017-05-10 ENCOUNTER — Other Ambulatory Visit: Payer: Self-pay | Admitting: *Deleted

## 2017-05-10 NOTE — Patient Outreach (Signed)
[employer] HRA started with Mrs. Vito Backerskueng as requested by pt, however, Mrs. Vito BackersKueng needed to leave for work and could not continue the conversation. She does reveal that Mr. Vito BackersKueng has a stroke a number of years ago and that is where all his problems began. She asked if she could call me back tomorrow to complete the screen and I will be waiting for her call.  Zara Councilarroll C. Burgess EstelleSpinks, MSN, [facility]NP-BC Gerontological Nurse Practitioner [facility] Care [facility] [phone]

## 2017-05-11 NOTE — Patient Outreach (Signed)
Erroneous encounter

## 2017-06-14 ENCOUNTER — Ambulatory Visit: Payer: Medicare HMO | Admitting: Family Medicine

## 2017-06-14 ENCOUNTER — Telehealth: Payer: Self-pay | Admitting: Sports Medicine

## 2017-06-14 DIAGNOSIS — M1A0710 Idiopathic chronic gout, right ankle and foot, without tophus (tophi): Secondary | ICD-10-CM

## 2017-06-14 MED ORDER — FEBUXOSTAT 40 MG PO TABS: 40.0000 mg | ORAL_TABLET | Freq: Every day | ORAL | 3 refills | Status: AC

## 2017-06-14 NOTE — Telephone Encounter (Signed)
Patient was scheduled for a follow up today on Dr. Shelah LewandowskyMetheney's schedule. I called patient to ask if he would still like to be seen today. Patient's wife stated that they would reschedule with you at a more convenient time. In the meantime, patient is wanting to change his gout medication from allopurinol to uloric 40mg . Patient stated that allopurinol is not working as well as the Ciscouloric. Uloric was discontinued due to cost; however, they would rather pay more for a medication that works well for him.   Pt also mentioned that he needs follow up labs for gout. I did not see any active labs ordered. Please advise. Thanks!

## 2017-06-14 NOTE — Telephone Encounter (Signed)
Added Uloric 40.

## 2017-08-06 ENCOUNTER — Telehealth: Payer: Self-pay | Admitting: Family Medicine

## 2017-08-07 NOTE — Telephone Encounter (Signed)
Noted, thanks!

## 2017-09-04 NOTE — Telephone Encounter (Signed)
NOTE opened in error during chart review

## 2017-09-04 NOTE — Telephone Encounter (Signed)
After hours nurse called. EMS called to get death certificate sent to office. EMS found pt DOA today.

## 2017-09-04 DEATH — deceased

## 2018-12-01 IMAGING — CT CT CHEST LUNG CANCER SCREENING LOW DOSE W/O CM
2 of 5 series · 15 of 40 positions shown, 18 images · non-contrast
Comparison: No comparison studies available.

CLINICAL DATA: 65-year-old male with 44 pack-year history of
smoking. Lung cancer screening.

EXAM:
CT CHEST WITHOUT CONTRAST LOW-DOSE FOR LUNG CANCER SCREENING
TECHNIQUE: Multidetector CT imaging of the chest was performed following the
standard protocol without IV contrast.

[Series 3: lungs · axial · 0.65mm/px · z∈[+1087,+1364]mm · 12 of 305 slices shown, 15 images]
[im 14/305  mediastinal]
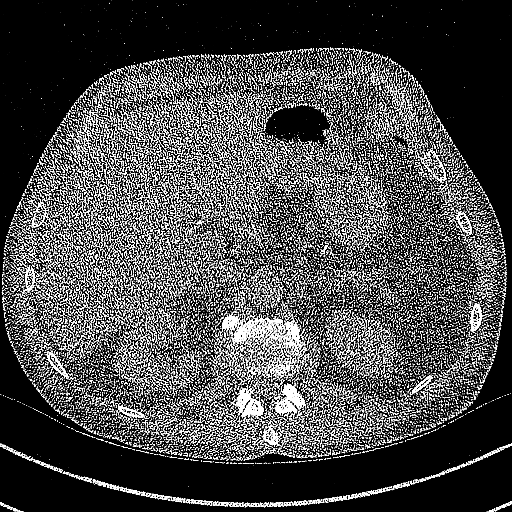
[im 14/305  lung]
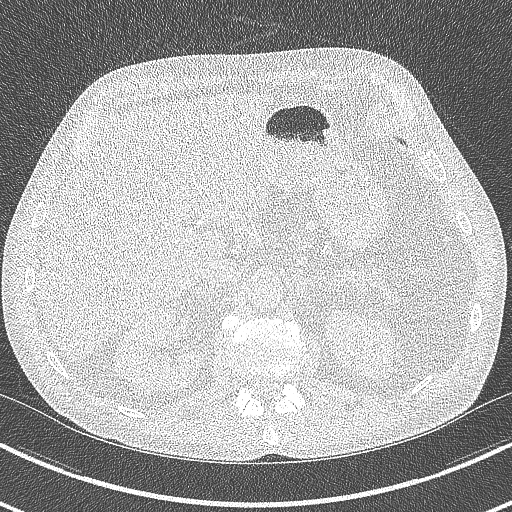
[im 42/305  lung]
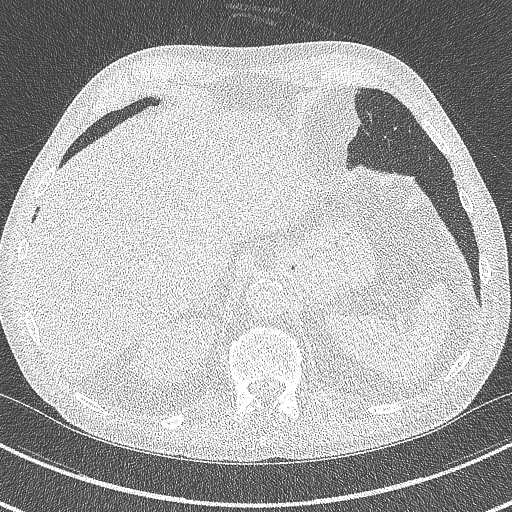
[im 70/305  lung]
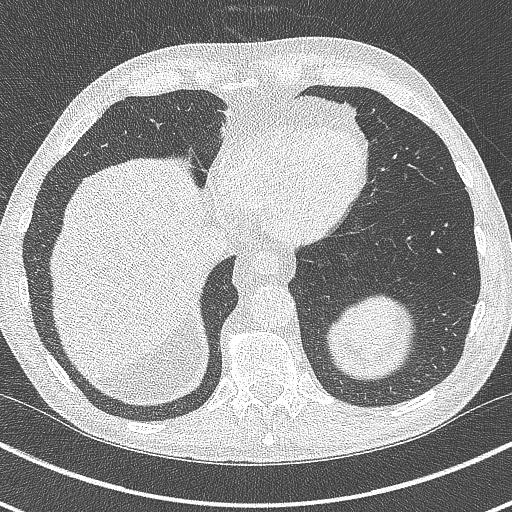
[im 97/305  lung]
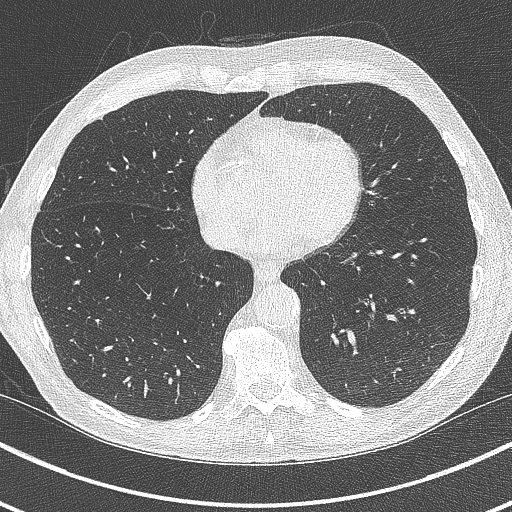
[im 111/305  mediastinal]
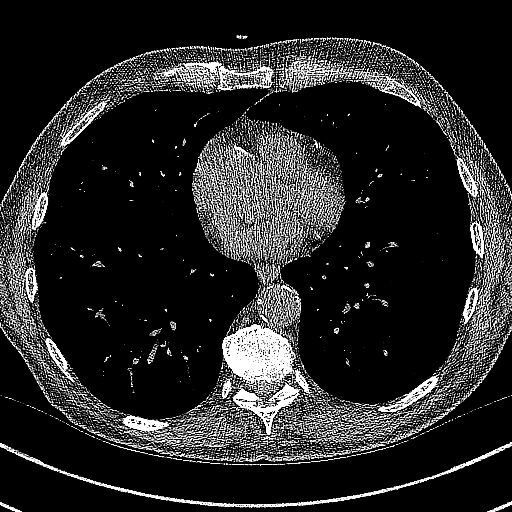
[im 111/305  lung]
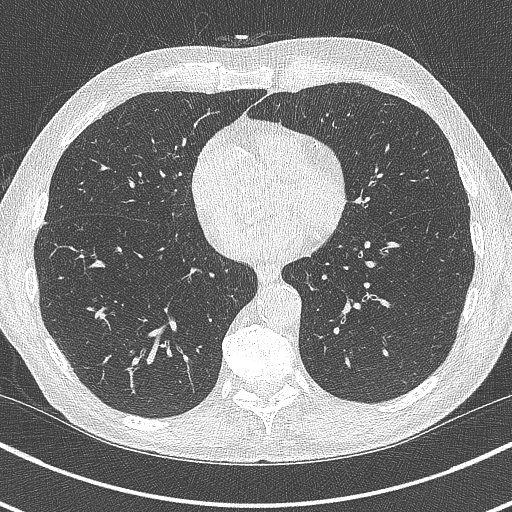
[im 139/305  lung]
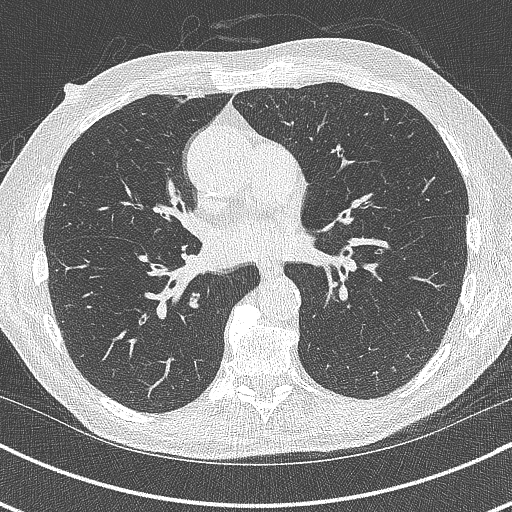
[im 166/305  lung]
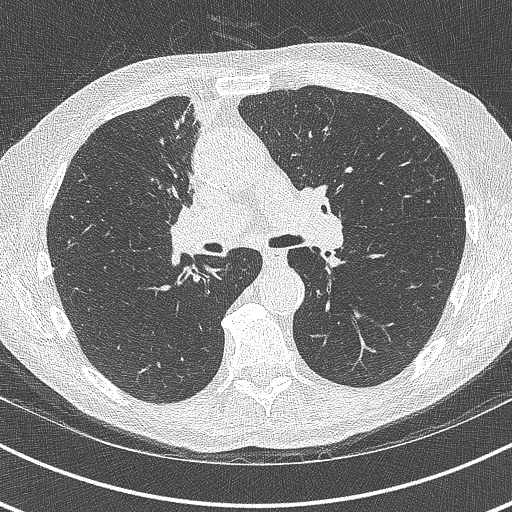
[im 194/305  lung]
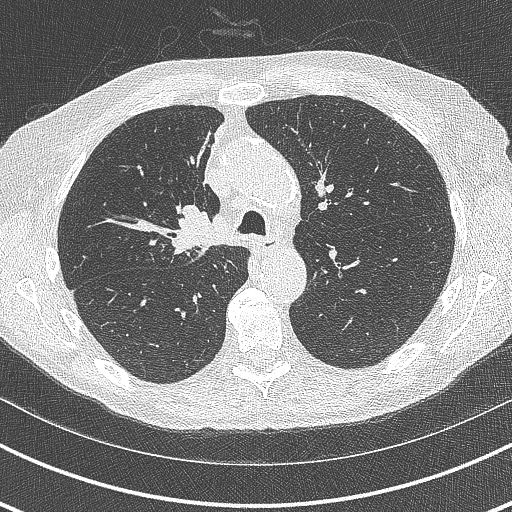
[im 208/305  mediastinal]
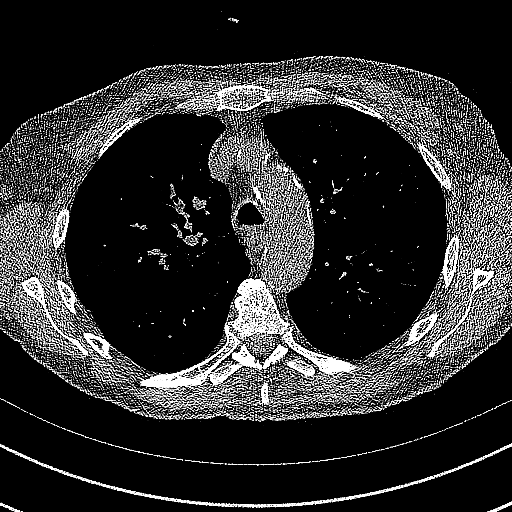
[im 208/305  lung]
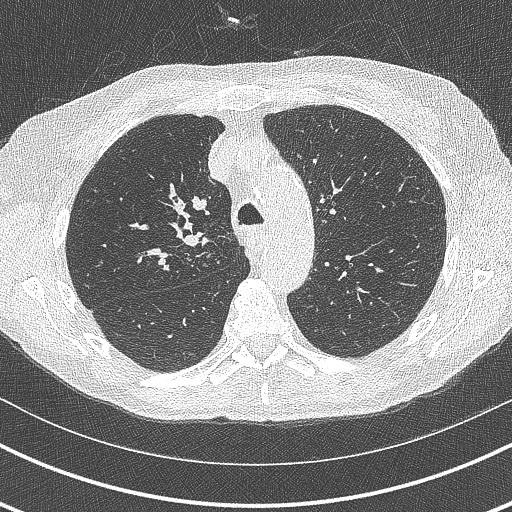
[im 235/305  lung]
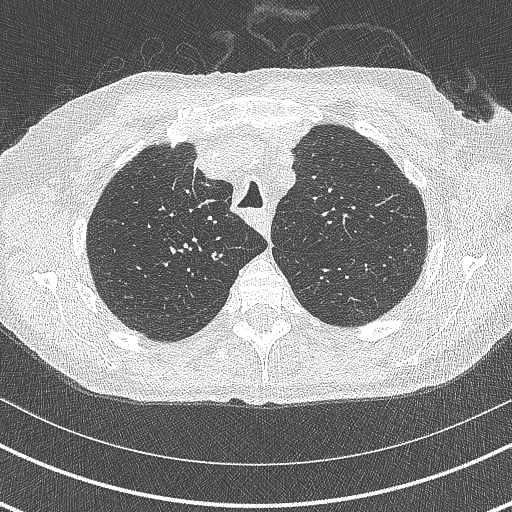
[im 263/305  lung]
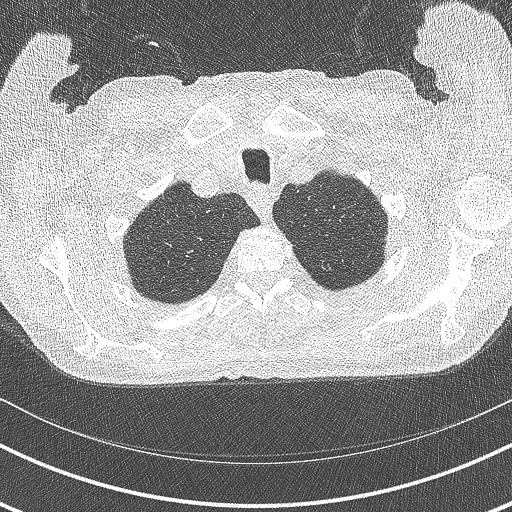
[im 291/305  lung]
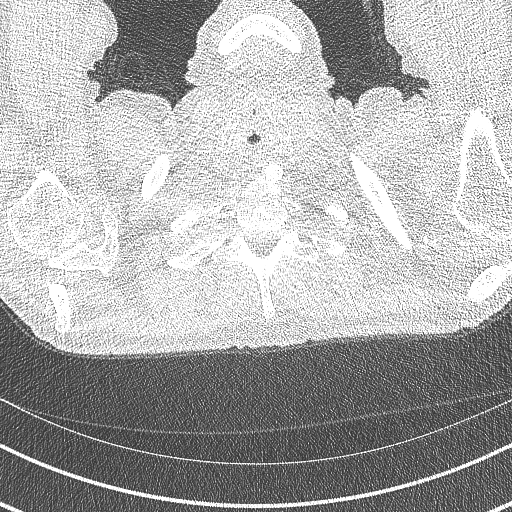

[Series 4: coronal · coronal · 0.61mm/px · 3 of 259 slices shown]
[im 52/259  lung]
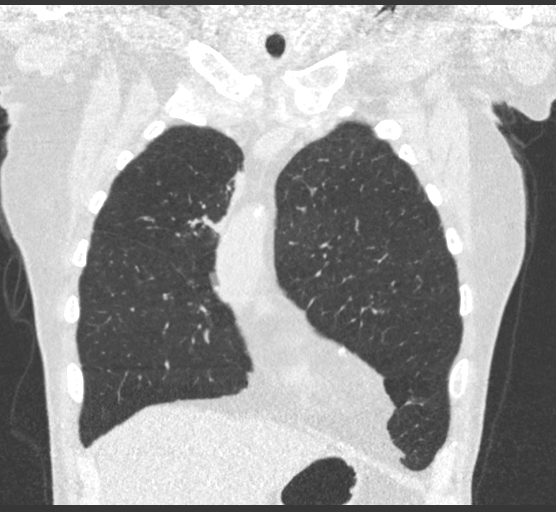
[im 104/259  lung]
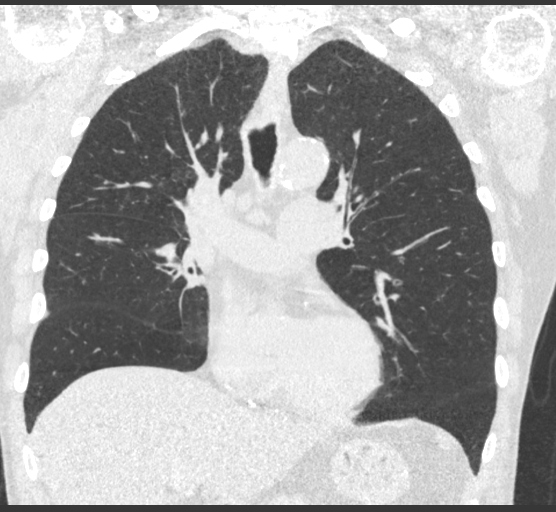
[im 155/259  lung]
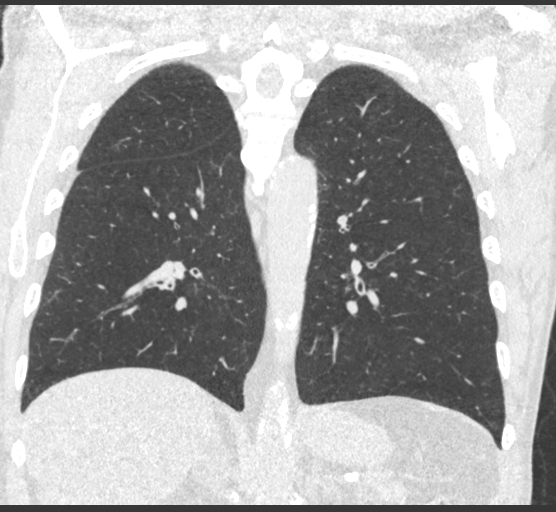

[15 of 40 positions shown; findings below may reference images not displayed]

FINDINGS: Cardiovascular: The heart size is normal. No pericardial effusion.
Coronary artery calcification is evident. Atherosclerotic
calcification is noted in the wall of the thoracic aorta. Ascending
thoracic aorta measures 4.6 cm diameter.

Mediastinum/Nodes: Scattered mediastinal lymph nodes of normal size
are evident. No evidence for gross hilar lymphadenopathy although
assessment is limited by the lack of intravenous contrast on today's
study. The esophagus has normal imaging features. There is no
axillary lymphadenopathy.

Lungs/Pleura: No overtly suspicious pulmonary nodule or mass
although the patient has innumerable centrilobular ground-glass
nodules in the right upper lobe and in the right middle lobe to a
lesser degree 8. This is associated with bronchial wall thickening
and numerous areas of small airway impaction in the right upper and
middle lobes. Patient also noted to have apparent mucus in the right
mainstem bronchus and nearly impacted debris in the right upper lobe
bronchus.

No dense focal airspace consolidation. No pulmonary edema or pleural
effusion.

Upper Abdomen: The liver shows diffusely decreased attenuation
suggesting steatosis.

Musculoskeletal: Bone windows reveal no worrisome lytic or sclerotic
osseous lesions.
IMPRESSION: 1. Innumerable centrilobular ground-glass nodules in the right upper
lobe associated with numerous areas of small airway impaction
bronchial wall thickening. Imaging features likely related atypical
infection (including non tuberculous mycobacterium). Consider
therapy prior to follow-up imaging. Lung-RADS 3, probably benign
findings. Short-term follow-up in 6 months is recommended with
repeat low-dose chest CT without contrast (please use the following
order, "CT CHEST LCS NODULE FOLLOW-UP W/O CM").
2. Ascending aorta measuring 4.6 cm diameter. Ascending thoracic
aortic aneurysm. Recommend semi-annual imaging followup by CTA or
MRA and referral to cardiothoracic surgery if not already obtained.
This recommendation follows 7414
ACCF/AHA/AATS/ACR/ASA/SCA/NANDOM/QUITO/MUGHAL/JIM Guidelines for the
Diagnosis and Management of Patients With Thoracic Aortic Disease.
Circulation. 7414; 121: [id number]
3. The liver shows diffusely decreased attenuation suggesting
steatosis.
4. Coronary artery and Aortic Atherosclerois ([id number]-170.0)
